# Patient Record
Sex: Female | Born: 1955 | Race: White | Hispanic: No | Marital: Married | Smoking: Never smoker
Health system: Southern US, Community
[De-identification: ages and names within clinical notes are randomized; demographics above are authoritative.]

## PROBLEM LIST (undated history)

## (undated) DIAGNOSIS — E785 Hyperlipidemia, unspecified: Secondary | ICD-10-CM

## (undated) DIAGNOSIS — K5790 Diverticulosis of intestine, part unspecified, without perforation or abscess without bleeding: Secondary | ICD-10-CM

## (undated) DIAGNOSIS — K589 Irritable bowel syndrome without diarrhea: Secondary | ICD-10-CM

## (undated) DIAGNOSIS — K219 Gastro-esophageal reflux disease without esophagitis: Secondary | ICD-10-CM

## (undated) HISTORY — DX: Diverticulosis of intestine, part unspecified, without perforation or abscess without bleeding: K57.90

## (undated) HISTORY — DX: Gastro-esophageal reflux disease without esophagitis: K21.9

## (undated) HISTORY — DX: Hyperlipidemia, unspecified: E78.5

---

## 1982-10-22 HISTORY — PX: TONSILLECTOMY: SUR1361

## 1984-10-22 HISTORY — PX: LAPAROSCOPY: SHX197

## 1997-10-22 HISTORY — PX: APPENDECTOMY: SHX54

## 2003-10-23 HISTORY — PX: ABDOMINAL HYSTERECTOMY: SHX81

## 2004-04-03 HISTORY — PX: UPPER GASTROINTESTINAL ENDOSCOPY: SHX188

## 2005-03-01 ENCOUNTER — Ambulatory Visit: Payer: Self-pay | Admitting: Obstetrics and Gynecology

## 2005-10-22 DIAGNOSIS — K5790 Diverticulosis of intestine, part unspecified, without perforation or abscess without bleeding: Secondary | ICD-10-CM

## 2005-10-22 HISTORY — DX: Diverticulosis of intestine, part unspecified, without perforation or abscess without bleeding: K57.90

## 2005-10-22 HISTORY — PX: COLONOSCOPY: SHX174

## 2006-03-05 ENCOUNTER — Ambulatory Visit: Payer: Self-pay | Admitting: Obstetrics and Gynecology

## 2006-03-18 ENCOUNTER — Ambulatory Visit: Payer: Self-pay | Admitting: Obstetrics and Gynecology

## 2006-07-05 ENCOUNTER — Ambulatory Visit: Payer: Self-pay | Admitting: Gastroenterology

## 2006-07-05 LAB — HM COLONOSCOPY

## 2007-05-22 ENCOUNTER — Ambulatory Visit: Payer: Self-pay | Admitting: Obstetrics and Gynecology

## 2008-06-30 ENCOUNTER — Ambulatory Visit: Payer: Self-pay | Admitting: Obstetrics and Gynecology

## 2009-06-20 ENCOUNTER — Ambulatory Visit: Payer: Self-pay | Admitting: Urology

## 2009-07-05 ENCOUNTER — Ambulatory Visit: Payer: Self-pay | Admitting: Unknown Physician Specialty

## 2010-07-12 ENCOUNTER — Ambulatory Visit: Payer: Self-pay

## 2011-07-27 ENCOUNTER — Ambulatory Visit: Payer: Self-pay | Admitting: Unknown Physician Specialty

## 2012-09-16 ENCOUNTER — Ambulatory Visit: Payer: Self-pay | Admitting: Family Medicine

## 2013-08-03 ENCOUNTER — Encounter: Payer: Self-pay | Admitting: General Surgery

## 2013-08-03 ENCOUNTER — Ambulatory Visit (INDEPENDENT_AMBULATORY_CARE_PROVIDER_SITE_OTHER): Payer: Managed Care, Other (non HMO) | Admitting: General Surgery

## 2013-08-03 VITALS — BP 130/80 | HR 78 | Resp 16 | Ht 65.0 in | Wt 120.0 lb

## 2013-08-03 DIAGNOSIS — Z1211 Encounter for screening for malignant neoplasm of colon: Secondary | ICD-10-CM

## 2013-08-03 NOTE — Progress Notes (Signed)
Patient ID: Christina Williams, female   DOB: 08-25-1956, 57 y.o.   MRN: [id number]  Chief Complaint  Patient presents with  . Other    evaluation of colonoscopy last done 2007    HPI Christina Williams is a 57 y.o. female who presents for an evaluation of a colonoscopy. The last colonoscopy performed was in 2007. At her last colonoscopy diverticulosis was noted. No known family or personal history of colon cancer or polyps. She denies any problems with the bowels at this time.   HPI  Past Medical History  Diagnosis Date  . Hyperlipidemia   . Diverticulosis 2007    Past Surgical History  Procedure Laterality Date  . Appendectomy  1999  . Tonsillectomy  1984  . Laparoscopy  1986  . Abdominal hysterectomy  2005  . Colonoscopy  2007    Family History  Problem Relation Age of Onset  . Cancer Mother     brain tumor    Social History History  Substance Use Topics  . Smoking status: Never Smoker   . Smokeless tobacco: Not on file  . Alcohol Use: Yes    No Known Allergies  Current Outpatient Prescriptions  Medication Sig Dispense Refill  . Calcium Carbonate-Vitamin D (CALCIUM 600 + D PO) Take 2 tablets by mouth daily.      . Lactobacillus (ACIDOPHILUS PO) Take 1 tablet by mouth daily.      . Omega-3 Fatty Acids (FISH OIL) 1200 MG CAPS Take 1 capsule by mouth daily.      . polycarbophil (FIBERCON) 625 MG tablet Take 3 tablets by mouth daily.      . simvastatin (ZOCOR) 20 MG tablet Take 1 tablet by mouth daily.       No current facility-administered medications for this visit.    Review of Systems Review of Systems  Constitutional: Negative.   Respiratory: Negative.   Cardiovascular: Negative.   Gastrointestinal: Negative.     Blood pressure 130/80, pulse 78, resp. rate 16, height 5\' 5"  (1.651 m), weight 120 lb (54.432 kg).  Physical Exam Physical Exam Not performed.  Data Reviewed  Previous colonoscopy report.   Assessment    Patient is not in need of colonoscopy  until 2017. No personal or family history of colon polyps or cancer. Last colonoscopy  in 2007 showed only diverticulosis.     Plan    Schedule follow up in 2017 for screening colonoscopy.       SANKAR,SEEPLAPUTHUR G 08/03/2013, 8:41 PM

## 2013-09-22 ENCOUNTER — Ambulatory Visit: Payer: Self-pay | Admitting: Family Medicine

## 2014-07-29 LAB — HEPATIC FUNCTION PANEL
ALT: 18 U/L (ref 7–35)
AST: 19 U/L (ref 13–35)

## 2014-07-29 LAB — BASIC METABOLIC PANEL
BUN: 11 mg/dL (ref 4–21)
CREATININE: 0.9 mg/dL (ref 0.5–1.1)
GLUCOSE: 99 mg/dL
Potassium: 4.5 mmol/L (ref 3.4–5.3)
Sodium: 142 mmol/L (ref 137–147)

## 2014-07-29 LAB — CBC AND DIFFERENTIAL
HCT: 43 % (ref 36–46)
Hemoglobin: 14.4 g/dL (ref 12.0–16.0)
Platelets: 230 10*3/uL (ref 150–399)
WBC: 4.9 10*3/mL

## 2014-07-29 LAB — TSH: TSH: 3.23 u[IU]/mL (ref 0.41–5.90)

## 2014-07-29 LAB — LIPID PANEL
CHOLESTEROL: 214 mg/dL — AB (ref 0–200)
HDL: 73 mg/dL — AB (ref 35–70)
LDL CALC: 124 mg/dL
TRIGLYCERIDES: 87 mg/dL (ref 40–160)

## 2014-08-23 ENCOUNTER — Encounter: Payer: Self-pay | Admitting: General Surgery

## 2014-10-01 ENCOUNTER — Ambulatory Visit: Payer: Self-pay | Admitting: Family Medicine

## 2014-10-01 LAB — HM MAMMOGRAPHY

## 2014-10-05 ENCOUNTER — Ambulatory Visit: Payer: Self-pay | Admitting: Family Medicine

## 2015-07-29 DIAGNOSIS — J309 Allergic rhinitis, unspecified: Secondary | ICD-10-CM | POA: Insufficient documentation

## 2015-07-29 DIAGNOSIS — M8580 Other specified disorders of bone density and structure, unspecified site: Secondary | ICD-10-CM | POA: Insufficient documentation

## 2015-07-29 DIAGNOSIS — Z87898 Personal history of other specified conditions: Secondary | ICD-10-CM | POA: Insufficient documentation

## 2015-07-29 DIAGNOSIS — E7800 Pure hypercholesterolemia, unspecified: Secondary | ICD-10-CM | POA: Insufficient documentation

## 2015-07-29 DIAGNOSIS — D229 Melanocytic nevi, unspecified: Secondary | ICD-10-CM | POA: Insufficient documentation

## 2015-07-29 DIAGNOSIS — R109 Unspecified abdominal pain: Secondary | ICD-10-CM | POA: Insufficient documentation

## 2015-07-29 DIAGNOSIS — N809 Endometriosis, unspecified: Secondary | ICD-10-CM | POA: Insufficient documentation

## 2015-07-29 DIAGNOSIS — K219 Gastro-esophageal reflux disease without esophagitis: Secondary | ICD-10-CM | POA: Insufficient documentation

## 2015-07-29 DIAGNOSIS — Z8619 Personal history of other infectious and parasitic diseases: Secondary | ICD-10-CM | POA: Insufficient documentation

## 2015-07-29 DIAGNOSIS — Z780 Asymptomatic menopausal state: Secondary | ICD-10-CM | POA: Insufficient documentation

## 2015-07-29 DIAGNOSIS — K589 Irritable bowel syndrome without diarrhea: Secondary | ICD-10-CM | POA: Insufficient documentation

## 2015-07-29 DIAGNOSIS — J302 Other seasonal allergic rhinitis: Secondary | ICD-10-CM | POA: Insufficient documentation

## 2015-08-03 ENCOUNTER — Encounter: Payer: Self-pay | Admitting: Physician Assistant

## 2015-08-03 ENCOUNTER — Ambulatory Visit: Payer: Commercial Managed Care - HMO | Admitting: Physician Assistant

## 2015-08-03 VITALS — BP 130/80 | HR 76 | Temp 98.2°F | Resp 16 | Ht 65.0 in | Wt 116.8 lb

## 2015-08-03 DIAGNOSIS — Z1239 Encounter for other screening for malignant neoplasm of breast: Secondary | ICD-10-CM

## 2015-08-03 DIAGNOSIS — Z124 Encounter for screening for malignant neoplasm of cervix: Secondary | ICD-10-CM

## 2015-08-03 DIAGNOSIS — Z1211 Encounter for screening for malignant neoplasm of colon: Secondary | ICD-10-CM

## 2015-08-03 DIAGNOSIS — Z0000 Encounter for general adult medical examination without abnormal findings: Secondary | ICD-10-CM

## 2015-08-03 DIAGNOSIS — E7800 Pure hypercholesterolemia, unspecified: Secondary | ICD-10-CM

## 2015-08-03 DIAGNOSIS — Z23 Encounter for immunization: Secondary | ICD-10-CM

## 2015-08-03 LAB — POCT URINALYSIS DIPSTICK
Bilirubin, UA: NEGATIVE
GLUCOSE UA: NEGATIVE
Ketones, UA: NEGATIVE
Leukocytes, UA: NEGATIVE
NITRITE UA: NEGATIVE
PH UA: 5
Protein, UA: NEGATIVE
RBC UA: NEGATIVE
SPEC GRAV UA: 1.015
UROBILINOGEN UA: 0.2

## 2015-08-03 LAB — IFOBT (OCCULT BLOOD): IFOBT: NEGATIVE

## 2015-08-03 NOTE — Progress Notes (Signed)
Patient: Christina Williams, Female    DOB: Apr 25, 1956, 59 y.o.   MRN: [id number] Visit Date: 08/03/2015  Today's Provider: Mar Daring, PA-C   Chief Complaint  Patient presents with  . Annual Exam   Subjective:    Annual physical exam Christina Williams is a 59 y.o. female who presents today for health maintenance and complete physical. She feels well. She reports exercising, rides bike, walks everyday and exercise videos. She reports she is sleeping fairly well.    Last PCP:07/28/2014 Mammogram: 10/01/2014 Further evaluation is suggested for possible mass in the left breast. Per patient went to get the other mammogram right after and it was normal. There is no family history of breast cancer. Colonoscopy: 07/05/2006 Diverticulosis. There is no family history of colon cancer. Pap: 2012 Normal. [facility] at [facility]. She is status post hysterectomy with cervix still intact and ovaries remaining. There is no family history of cervical or ovarian cancer. She has never tested HPV positive.   Review of Systems  Constitutional: Negative.   HENT: Negative.   Eyes: Negative.        Watery eyes  Respiratory: Negative.   Cardiovascular: Negative.   Gastrointestinal: Negative.   Endocrine: Negative.   Genitourinary: Negative.   Musculoskeletal: Negative.   Skin: Negative.   Allergic/Immunologic: Negative.   Neurological: Negative.   Hematological: Negative.   Psychiatric/Behavioral: Negative.     Social History She  reports that she has never smoked. She does not have any smokeless tobacco history on file. She reports that she drinks alcohol. She reports that she does not use illicit drugs. Social History   Social History  . Marital Status: Married    Spouse Name: N/A  . Number of Children: 2  . Years of Education: N/A   Social History Main Topics  . Smoking status: Never Smoker   . Smokeless tobacco: None  . Alcohol Use: Yes     Comment: Ocassional  . Drug  Use: No  . Sexual Activity: Not Asked   Other Topics Concern  . None   Social History Narrative    Patient Active Problem List   Diagnosis Date Noted  . Abdominal pain 07/29/2015  . Allergic rhinitis 07/29/2015  . Melanocytic nevus of skin 07/29/2015  . History of measles, mumps, or rubella 07/29/2015  . Endometriosis 07/29/2015  . GERD (gastroesophageal reflux disease) 07/29/2015  . Hypercholesteremia 07/29/2015  . Irritable bowel syndrome 07/29/2015  . Osteopenia 07/29/2015  . Postmenopausal 07/29/2015  . Seasonal allergies 07/29/2015    Past Surgical History  Procedure Laterality Date  . Appendectomy  1999  . Tonsillectomy  1984  . Laparoscopy  1986  . Abdominal hysterectomy  2005  . Colonoscopy  2007  . Upper gastrointestinal endoscopy  04/03/2004    Normal    Family History  Family Status  Relation Status Death Age  . Mother Deceased 75    Diverticulosis of colon  . Father Deceased 25    died fromheart infection; CABG  . Sister Alive   . Brother Alive     stent age 55  . Sister Alive    Her family history includes CAD in her father and mother; Cancer in her maternal aunt and mother; Diabetes in her mother and sister; GER disease in her father; Hyperlipidemia in her sister; Hypertension in her mother and sister.    No Known Allergies  Previous Medications   CETIRIZINE (ZYRTEC) 10 MG TABLET  Take 10 mg by mouth daily.   ESTRADIOL (ESTRACE VAGINAL) 0.1 MG/GM VAGINAL CREAM    Place vaginally.   HYOSCYAMINE (LEVBID) 0.375 MG 12 HR TABLET    Take by mouth.   LACTOBACILLUS (ACIDOPHILUS PO)    Take 1 tablet by mouth daily.   POLYCARBOPHIL (FIBERCON) 625 MG TABLET    Take 3 tablets by mouth daily.   SIMVASTATIN (ZOCOR) 20 MG TABLET    Take 1 tablet by mouth daily.    Patient Care Team: Margarita Rana, MD as PCP - General (Family Medicine) Seeplaputhur Robinette Haines, MD (General Surgery) Margarita Rana, MD as Referring Physician (Family Medicine)     Objective:    Vitals: BP 130/80 mmHg  Pulse 76  Temp(Src) 98.2 F (36.8 C) (Oral)  Resp 16  Ht 5\' 5"  (1.651 m)  Wt 116 lb 12.8 oz (52.98 kg)  BMI 19.44 kg/m2   Physical Exam  Constitutional: She is oriented to person, place, and time. She appears well-developed and well-nourished. No distress.  HENT:  Head: Normocephalic and atraumatic.  Right Ear: Hearing, tympanic membrane, external ear and ear canal normal.  Left Ear: Hearing, tympanic membrane, external ear and ear canal normal.  Nose: Nose normal.  Mouth/Throat: Uvula is midline, oropharynx is clear and moist and mucous membranes are normal. No oropharyngeal exudate.  Eyes: Conjunctivae and EOM are normal. Pupils are equal, round, and reactive to light. Right eye exhibits no discharge. Left eye exhibits no discharge. No scleral icterus.  Neck: Normal range of motion. Neck supple. No JVD present. Carotid bruit is not present. No tracheal deviation present. No thyromegaly present.  Cardiovascular: Normal rate, regular rhythm, normal heart sounds and intact distal pulses.  Exam reveals no gallop and no friction rub.   No murmur heard. Pulmonary/Chest: Effort normal and breath sounds normal. No respiratory distress. She has no wheezes. She has no rales. She exhibits no tenderness. Right breast exhibits no inverted nipple, no mass, no nipple discharge, no skin change and no tenderness. Left breast exhibits no inverted nipple, no mass, no nipple discharge, no skin change and no tenderness. Breasts are symmetrical.  Abdominal: Soft. Bowel sounds are normal. She exhibits no distension and no mass. There is no tenderness. There is no rebound and no guarding. Hernia confirmed negative in the right inguinal area and confirmed negative in the left inguinal area.  Genitourinary: Rectum normal and vagina normal. Rectal exam shows no external hemorrhoid, no internal hemorrhoid, no fissure, no mass, no tenderness and anal tone normal. Guaiac negative stool. No  breast swelling, tenderness, discharge or bleeding. Pelvic exam was performed with patient supine. There is no rash, tenderness, lesion or injury on the right labia. There is no rash, tenderness, lesion or injury on the left labia. Cervix exhibits no motion tenderness, no discharge and no friability. Right adnexum displays no mass, no tenderness and no fullness. Left adnexum displays no mass, no tenderness and no fullness. No erythema, tenderness or bleeding in the vagina. No signs of injury around the vagina. No vaginal discharge found.  Uterus is surgically absent  Musculoskeletal: Normal range of motion. She exhibits no edema or tenderness.  Lymphadenopathy:    She has no cervical adenopathy.       Right: No inguinal adenopathy present.       Left: No inguinal adenopathy present.  Neurological: She is alert and oriented to person, place, and time. She has normal reflexes. No cranial nerve deficit. Coordination normal.  Skin: Skin is warm and dry.  No rash noted. She is not diaphoretic.  Psychiatric: She has a normal mood and affect. Her behavior is normal. Judgment and thought content normal.  Vitals reviewed.    Depression Screen No flowsheet data found.    Assessment & Plan:     Routine Health Maintenance and Physical Exam  1. Annual physical exam UA was negative today. I will check labs as below and follow-up pending lab results or in one year for repeat annual physical exam if labs are normal. - POCT urinalysis dipstick - CBC with Differential - Comprehensive metabolic panel - TSH  2. Need for influenza vaccination Flu vaccine was given today without complications. - Flu Vaccine QUAD 36+ mos IM  3. Breast cancer screening Normal breast exam today. Order for mammogram has been placed. Mammogram will be due in December 2016. - Mammogram Digital Screening; Future  4. Cervical cancer screening Pelvic exam was normal today. Pap was done today. Will await results and follow-up  pending results. - Pap IG w/ reflex to HPV when [facility] ([facility])  5. Colon cancer screening OC-lite was negative today. She will be due for colonoscopy next year. - IFOBT POC (occult bld, rslt in office)  6. Hypercholesterolemia History of high cholesterol. She has been on simvastatin 20 mg. She tries to adhere to a low-cholesterol, low-fat diet and exercises regularly. I will check a lipid panel as below and follow-up pending these results. - Lipid panel   Exercise Activities and Dietary recommendations Goals    None      Immunization History  Administered Date(s) Administered  . Tdap 02/01/2010    Health Maintenance  Topic Date Due  . Hepatitis C Screening  1955-12-20  . HIV Screening  12/27/1970  . PAP SMEAR  12/26/1976  . INFLUENZA VACCINE  05/23/2015  . COLONOSCOPY  07/05/2016  . MAMMOGRAM  10/01/2016  . TETANUS/TDAP  02/02/2020      Discussed health benefits of physical activity, and encouraged her to engage in regular exercise appropriate for her age and condition.

## 2015-08-03 NOTE — Patient Instructions (Signed)
Health Maintenance, Female Adopting a healthy lifestyle and getting preventive care can go a long way to promote health and wellness. Talk with your health care provider about what schedule of regular examinations is right for you. This is a good chance for you to check in with your provider about disease prevention and staying healthy. In between checkups, there are plenty of things you can do on your own. Experts have done a lot of research about which lifestyle changes and preventive measures are most likely to keep you healthy. Ask your health care provider for more information. WEIGHT AND DIET  Eat a healthy diet 1. Be sure to include plenty of vegetables, fruits, low-fat dairy products, and lean protein. 2. Do not eat a lot of foods high in solid fats, added sugars, or salt. 3. Get regular exercise. This is one of the most important things you can do for your health. 1. Most adults should exercise for at least 150 minutes each week. The exercise should increase your heart rate and make you sweat (moderate-intensity exercise). 2. Most adults should also do strengthening exercises at least twice a week. This is in addition to the moderate-intensity exercise.  Maintain a healthy weight 1. Body mass index (BMI) is a measurement that can be used to identify possible weight problems. It estimates body fat based on height and weight. Your health care provider can help determine your BMI and help you achieve or maintain a healthy weight. 2. For females 56 years of age and older:  1. A BMI below 18.5 is considered underweight. 2. A BMI of 18.5 to 24.9 is normal. 3. A BMI of 25 to 29.9 is considered overweight. 4. A BMI of 30 and above is considered obese.  Watch levels of cholesterol and blood lipids 1. You should start having your blood tested for lipids and cholesterol at 59 years of age, then have this test every 5 years. 2. You may need to have your cholesterol levels checked more often  if: 1. Your lipid or cholesterol levels are high. 2. You are older than 59 years of age. 3. You are at high risk for heart disease.  CANCER SCREENING   Lung Cancer 1. Lung cancer screening is recommended for adults 53-40 years old who are at high risk for lung cancer because of a history of smoking. 2. A yearly low-dose CT scan of the lungs is recommended for people who: 1. Currently smoke. 2. Have quit within the past 15 years. 3. Have at least a 30-pack-year history of smoking. A pack year is smoking an average of one pack of cigarettes a day for 1 year. 3. Yearly screening should continue until it has been 15 years since you quit. 4. Yearly screening should stop if you develop a health problem that would prevent you from having lung cancer treatment.  Breast Cancer  Practice breast self-awareness. This means understanding how your breasts normally appear and feel.  It also means doing regular breast self-exams. Let your health care provider know about any changes, no matter how small.  If you are in your 20s or 30s, you should have a clinical breast exam (CBE) by a health care provider every 1-3 years as part of a regular health exam.  If you are 60 or older, have a CBE every year. Also consider having a breast X-ray (mammogram) every year.  If you have a family history of breast cancer, talk to your health care provider about genetic screening.  If you  are at high risk for breast cancer, talk to your health care provider about having an MRI and a mammogram every year.  Breast cancer gene (BRCA) assessment is recommended for women who have family members with BRCA-related cancers. BRCA-related cancers include:  Breast.  Ovarian.  Tubal.  Peritoneal cancers.  Results of the assessment will determine the need for genetic counseling and BRCA1 and BRCA2 testing. Cervical Cancer Your health care provider may recommend that you be screened regularly for cancer of the pelvic  organs (ovaries, uterus, and vagina). This screening involves a pelvic examination, including checking for microscopic changes to the surface of your cervix (Pap test). You may be encouraged to have this screening done every 3 years, beginning at age 23.  For women ages 38-65, health care providers may recommend pelvic exams and Pap testing every 3 years, or they may recommend the Pap and pelvic exam, combined with testing for human papilloma virus (HPV), every 5 years. Some types of HPV increase your risk of cervical cancer. Testing for HPV may also be done on women of any age with unclear Pap test results.  Other health care providers may not recommend any screening for nonpregnant women who are considered low risk for pelvic cancer and who do not have symptoms. Ask your health care provider if a screening pelvic exam is right for you.  If you have had past treatment for cervical cancer or a condition that could lead to cancer, you need Pap tests and screening for cancer for at least 20 years after your treatment. If Pap tests have been discontinued, your risk factors (such as having a new sexual partner) need to be reassessed to determine if screening should resume. Some women have medical problems that increase the chance of getting cervical cancer. In these cases, your health care provider may recommend more frequent screening and Pap tests. Colorectal Cancer  This type of cancer can be detected and often prevented.  Routine colorectal cancer screening usually begins at 59 years of age and continues through 59 years of age.  Your health care provider may recommend screening at an earlier age if you have risk factors for colon cancer.  Your health care provider may also recommend using home test kits to check for hidden blood in the stool.  A small camera at the end of a tube can be used to examine your colon directly (sigmoidoscopy or colonoscopy). This is done to check for the earliest forms  of colorectal cancer.  Routine screening usually begins at age 17.  Direct examination of the colon should be repeated every 5-10 years through 59 years of age. However, you may need to be screened more often if early forms of precancerous polyps or small growths are found. Skin Cancer  Check your skin from head to toe regularly.  Tell your health care provider about any new moles or changes in moles, especially if there is a change in a mole's shape or color.  Also tell your health care provider if you have a mole that is larger than the size of a pencil eraser.  Always use sunscreen. Apply sunscreen liberally and repeatedly throughout the day.  Protect yourself by wearing long sleeves, pants, a wide-brimmed hat, and sunglasses whenever you are outside. HEART DISEASE, DIABETES, AND HIGH BLOOD PRESSURE   High blood pressure causes heart disease and increases the risk of stroke. High blood pressure is more likely to develop in:  People who have blood pressure in the high end  of the normal range (130-139/85-89 mm Hg).  People who are overweight or obese.  People who are African American.  If you are 85-42 years of age, have your blood pressure checked every 3-5 years. If you are 20 years of age or older, have your blood pressure checked every year. You should have your blood pressure measured twice--once when you are at a hospital or clinic, and once when you are not at a hospital or clinic. Record the average of the two measurements. To check your blood pressure when you are not at a hospital or clinic, you can use:  An automated blood pressure machine at a pharmacy.  A home blood pressure monitor.  If you are between 19 years and 53 years old, ask your health care provider if you should take aspirin to prevent strokes.  Have regular diabetes screenings. This involves taking a blood sample to check your fasting blood sugar level.  If you are at a normal weight and have a low risk  for diabetes, have this test once every three years after 59 years of age.  If you are overweight and have a high risk for diabetes, consider being tested at a younger age or more often. PREVENTING INFECTION  Hepatitis B  If you have a higher risk for hepatitis B, you should be screened for this virus. You are considered at high risk for hepatitis B if:  You were born in a country where hepatitis B is common. Ask your health care provider which countries are considered high risk.  Your parents were born in a high-risk country, and you have not been immunized against hepatitis B (hepatitis B vaccine).  You have HIV or AIDS.  You use needles to inject street drugs.  You live with someone who has hepatitis B.  You have had sex with someone who has hepatitis B.  You get hemodialysis treatment.  You take certain medicines for conditions, including cancer, organ transplantation, and autoimmune conditions. Hepatitis C  Blood testing is recommended for:  Everyone born from [birth year] through 1965.  Anyone with known risk factors for hepatitis C. Sexually transmitted infections (STIs)  You should be screened for sexually transmitted infections (STIs) including gonorrhea and chlamydia if:  You are sexually active and are younger than 59 years of age.  You are older than 59 years of age and your health care provider tells you that you are at risk for this type of infection.  Your sexual activity has changed since you were last screened and you are at an increased risk for chlamydia or gonorrhea. Ask your health care provider if you are at risk.  If you do not have HIV, but are at risk, it may be recommended that you take a prescription medicine daily to prevent HIV infection. This is called pre-exposure prophylaxis (PrEP). You are considered at risk if:  You are sexually active and do not regularly use condoms or know the HIV status of your partner(s).  You take drugs by injection.  You  are sexually active with a partner who has HIV. Talk with your health care provider about whether you are at high risk of being infected with HIV. If you choose to begin PrEP, you should first be tested for HIV. You should then be tested every 3 months for as long as you are taking PrEP.  PREGNANCY   If you are premenopausal and you may become pregnant, ask your health care provider about preconception counseling.  If you may  become pregnant, take 400 to 800 micrograms (mcg) of folic acid every day.  If you want to prevent pregnancy, talk to your health care provider about birth control (contraception). OSTEOPOROSIS AND MENOPAUSE   Osteoporosis is a disease in which the bones lose minerals and strength with aging. This can result in serious bone fractures. Your risk for osteoporosis can be identified using a bone density scan.  If you are 65 years of age or older, or if you are at risk for osteoporosis and fractures, ask your health care provider if you should be screened.  Ask your health care provider whether you should take a calcium or vitamin D supplement to lower your risk for osteoporosis.  Menopause may have certain physical symptoms and risks.  Hormone replacement therapy may reduce some of these symptoms and risks. Talk to your health care provider about whether hormone replacement therapy is right for you.  HOME CARE INSTRUCTIONS   Schedule regular health, dental, and eye exams.  Stay current with your immunizations.   Do not use any tobacco products including cigarettes, chewing tobacco, or electronic cigarettes.  If you are pregnant, do not drink alcohol.  If you are breastfeeding, limit how much and how often you drink alcohol.  Limit alcohol intake to no more than 1 drink per day for nonpregnant women. One drink equals 12 ounces of beer, 5 ounces of wine, or 1 ounces of hard liquor.  Do not use street drugs.  Do not share needles.  Ask your health care provider  for help if you need support or information about quitting drugs.  Tell your health care provider if you often feel depressed.  Tell your health care provider if you have ever been abused or do not feel safe at home.   This information is not intended to replace advice given to you by your health care provider. Make sure you discuss any questions you have with your health care provider.   Document Released: 04/23/2011 Document Revised: 10/29/2014 Document Reviewed: 09/09/2013 Elsevier Interactive Patient Education [employer].     Why follow it? Research shows. . Those who follow the Mediterranean diet have a reduced risk of heart disease  . The diet is associated with a reduced incidence of Parkinson's and Alzheimer's diseases . People following the diet may have longer life expectancies and lower rates of chronic diseases  . The Dietary Guidelines for Americans recommends the Mediterranean diet as an eating plan to promote health and prevent disease  What Is the Mediterranean Diet?  . Healthy eating plan based on typical foods and recipes of Mediterranean-style cooking . The diet is primarily a plant based diet; these foods should make up a majority of meals   Starches - Plant based foods should make up a majority of meals - They are an important sources of vitamins, minerals, energy, antioxidants, and fiber - Choose whole grains, foods high in fiber and minimally processed items  - Typical grain sources include wheat, oats, barley, corn, brown rice, bulgar, farro, millet, polenta, couscous  - Various types of beans include chickpeas, lentils, fava beans, black beans, white beans   Fruits  Veggies - Large quantities of antioxidant rich fruits & veggies; 6 or more servings  - Vegetables can be eaten raw or lightly drizzled with oil and cooked  - Vegetables common to the traditional Mediterranean Diet include: artichokes, arugula, beets, broccoli, brussel sprouts, cabbage, carrots,  celery, collard greens, cucumbers, eggplant, kale, leeks, lemons, lettuce, mushrooms, okra, onions,  peas, peppers, potatoes, pumpkin, radishes, rutabaga, shallots, spinach, sweet potatoes, turnips, zucchini - Fruits common to the Mediterranean Diet include: apples, apricots, avocados, cherries, clementines, dates, figs, grapefruits, grapes, melons, nectarines, oranges, peaches, pears, pomegranates, strawberries, tangerines  Fats - Replace butter and margarine with healthy oils, such as olive oil, canola oil, and tahini  - Limit nuts to no more than a handful a day  - Nuts include walnuts, almonds, pecans, pistachios, pine nuts  - Limit or avoid candied, honey roasted or heavily salted nuts - Olives are central to the Mediterranean diet - can be eaten whole or used in a variety of dishes   Meats Protein - Limiting red meat: no more than a few times a month - When eating red meat: choose lean cuts and keep the portion to the size of deck of cards - Eggs: approx. 0 to 4 times a week  - Fish and lean poultry: at least 2 a week  - Healthy protein sources include, chicken, turkey, lean beef, lamb - Increase intake of seafood such as tuna, salmon, trout, mackerel, shrimp, scallops - Avoid or limit high fat processed meats such as sausage and bacon  Dairy - Include moderate amounts of low fat dairy products  - Focus on healthy dairy such as fat free yogurt, skim milk, low or reduced fat cheese - Limit dairy products higher in fat such as whole or 2% milk, cheese, ice cream  Alcohol - Moderate amounts of red wine is ok  - No more than 5 oz daily for women (all ages) and men older than age 65  - No more than 10 oz of wine daily for men younger than 65  Other - Limit sweets and other desserts  - Use herbs and spices instead of salt to flavor foods  - Herbs and spices common to the traditional Mediterranean Diet include: basil, bay leaves, chives, cloves, cumin, fennel, garlic, lavender, marjoram, mint,  oregano, parsley, pepper, rosemary, sage, savory, sumac, tarragon, thyme   It's not just a diet, it's a lifestyle:  . The Mediterranean diet includes lifestyle factors typical of those in the region  . Foods, drinks and meals are best eaten with others and savored . Daily physical activity is important for overall good health . This could be strenuous exercise like running and aerobics . This could also be more leisurely activities such as walking, housework, yard-work, or taking the stairs . Moderation is the key; a balanced and healthy diet accommodates most foods and drinks . Consider portion sizes and frequency of consumption of certain foods   Meal Ideas & Options:  . Breakfast:  o Whole wheat toast or whole wheat English muffins with peanut butter & hard boiled egg o Steel cut oats topped with apples & cinnamon and skim milk  o Fresh fruit: banana, strawberries, melon, berries, peaches  o Smoothies: strawberries, bananas, greek yogurt, peanut butter o Low fat greek yogurt with blueberries and granola  o Egg white omelet with spinach and mushrooms o Breakfast couscous: whole wheat couscous, apricots, skim milk, cranberries  . Sandwiches:  o Hummus and grilled vegetables (peppers, zucchini, squash) on whole wheat bread   o Grilled chicken on whole wheat pita with lettuce, tomatoes, cucumbers or tzatziki  o Tuna salad on whole wheat bread: tuna salad made with greek yogurt, olives, red peppers, capers, green onions o Garlic rosemary lamb pita: lamb sauted with garlic, rosemary, salt & pepper; add lettuce, cucumber, greek yogurt to pita - flavor with   lemon juice and black pepper  . Seafood:  o Mediterranean grilled salmon, seasoned with garlic, basil, parsley, lemon juice and black pepper o Shrimp, lemon, and spinach whole-grain pasta salad made with low fat greek yogurt  o Seared scallops with lemon orzo  o Seared tuna steaks seasoned salt, pepper, coriander topped with tomato  mixture of olives, tomatoes, olive oil, minced garlic, parsley, green onions and cappers  . Meats:  o Herbed greek chicken salad with kalamata olives, cucumber, feta  o Red bell peppers stuffed with spinach, bulgur, lean ground beef (or lentils) & topped with feta   o Kebabs: skewers of chicken, tomatoes, onions, zucchini, squash  o [place] burgers: made with red onions, mint, dill, lemon juice, feta cheese topped with roasted red peppers . Vegetarian o Cucumber salad: cucumbers, artichoke hearts, celery, red onion, feta cheese, tossed in olive oil & lemon juice  o Hummus and whole grain pita points with a greek salad (lettuce, tomato, feta, olives, cucumbers, red onion) o Lentil soup with celery, carrots made with vegetable broth, garlic, salt and pepper  o Tabouli salad: parsley, bulgur, mint, scallions, cucumbers, tomato, radishes, lemon juice, olive oil, salt and pepper.      American Heart Association (AHA) Exercise Recommendation  Being physically active is important to prevent heart disease and stroke, the nation's No. 1and No. 5killers. To improve overall cardiovascular health, we suggest at least 150 minutes per week of moderate exercise or 75 minutes per week of vigorous exercise (or a combination of moderate and vigorous activity). Thirty minutes a day, five times a week is an easy goal to remember. You will also experience benefits even if you divide your time into two or three segments of 10 to 15 minutes per day.  For people who would benefit from lowering their blood pressure or cholesterol, we recommend 40 minutes of aerobic exercise of moderate to vigorous intensity three to four times a week to lower the risk for heart attack and stroke.  Physical activity is anything that makes you move your body and burn calories.  This includes things like climbing stairs or playing sports. Aerobic exercises benefit your heart, and include walking, jogging, swimming or biking. Strength and  stretching exercises are best for overall stamina and flexibility.  The simplest, positive change you can make to effectively improve your heart health is to start walking. It's enjoyable, free, easy, social and great exercise. A walking program is flexible and boasts high success rates because people can stick with it. It's easy for walking to become a regular and satisfying part of life.   For Overall Cardiovascular Health:  At least 30 minutes of moderate-intensity aerobic activity at least 5 days per week for a total of 150  OR   At least 25 minutes of vigorous aerobic activity at least 3 days per week for a total of 75 minutes; or a combination of moderate- and vigorous-intensity aerobic activity  AND   Moderate- to high-intensity muscle-strengthening activity at least 2 days per week for additional health benefits.  For Lowering Blood Pressure and Cholesterol  An average 40 minutes of moderate- to vigorous-intensity aerobic activity 3 or 4 times per week  What if I can't make it to the time goal? Something is always better than nothing! And everyone has to start somewhere. Even if you've been sedentary for years, today is the day you can begin to make healthy changes in your life. If you don't think you'll make it for 30 or  40 minutes, set a reachable goal for today. You can work up toward your overall goal by increasing your time as you get stronger. Don't let all-or-nothing thinking rob you of doing what you can every day.  Source:http://www.heart.org

## 2015-08-07 LAB — PAP IG W/ RFLX HPV ASCU: PAP Smear Comment: 0

## 2015-08-08 ENCOUNTER — Telehealth: Payer: Self-pay

## 2015-08-08 NOTE — Telephone Encounter (Signed)
-----   Message from Mar Daring, PA-C sent at 08/08/2015  8:48 AM EDT ----- Normal pap

## 2015-08-08 NOTE — Telephone Encounter (Signed)
Patient advised as directed below.  Thanks,  -Seferino Oscar 

## 2015-08-13 LAB — CBC WITH DIFFERENTIAL/PLATELET
BASOS: 1 %
Basophils Absolute: 0 10*3/uL (ref 0.0–0.2)
EOS (ABSOLUTE): 0.2 10*3/uL (ref 0.0–0.4)
EOS: 3 %
HEMATOCRIT: 42.2 % (ref 34.0–46.6)
Hemoglobin: 14.1 g/dL (ref 11.1–15.9)
IMMATURE GRANS (ABS): 0 10*3/uL (ref 0.0–0.1)
IMMATURE GRANULOCYTES: 0 %
LYMPHS: 42 %
Lymphocytes Absolute: 2.4 10*3/uL (ref 0.7–3.1)
MCH: 30.9 pg (ref 26.6–33.0)
MCHC: 33.4 g/dL (ref 31.5–35.7)
MCV: 93 fL (ref 79–97)
Monocytes Absolute: 0.7 10*3/uL (ref 0.1–0.9)
Monocytes: 13 %
NEUTROS PCT: 41 %
Neutrophils Absolute: 2.3 10*3/uL (ref 1.4–7.0)
PLATELETS: 196 10*3/uL (ref 150–379)
RBC: 4.56 x10E6/uL (ref 3.77–5.28)
RDW: 13 % (ref 12.3–15.4)
WBC: 5.7 10*3/uL (ref 3.4–10.8)

## 2015-08-13 LAB — COMPREHENSIVE METABOLIC PANEL
A/G RATIO: 2 (ref 1.1–2.5)
ALT: 19 IU/L (ref 0–32)
AST: 21 IU/L (ref 0–40)
Albumin: 4.3 g/dL (ref 3.5–5.5)
Alkaline Phosphatase: 62 IU/L (ref 39–117)
BUN/Creatinine Ratio: 17 (ref 9–23)
BUN: 14 mg/dL (ref 6–24)
Bilirubin Total: 0.8 mg/dL (ref 0.0–1.2)
CALCIUM: 9.7 mg/dL (ref 8.7–10.2)
CO2: 26 mmol/L (ref 18–29)
CREATININE: 0.83 mg/dL (ref 0.57–1.00)
Chloride: 102 mmol/L (ref 97–106)
GFR, EST AFRICAN AMERICAN: 89 mL/min/{1.73_m2} (ref >59)
GFR, EST NON AFRICAN AMERICAN: 77 mL/min/{1.73_m2} (ref >59)
GLOBULIN, TOTAL: 2.2 g/dL (ref 1.5–4.5)
Glucose: 90 mg/dL (ref 65–99)
POTASSIUM: 4.8 mmol/L (ref 3.5–5.2)
Sodium: 142 mmol/L (ref 136–144)
TOTAL PROTEIN: 6.5 g/dL (ref 6.0–8.5)

## 2015-08-13 LAB — LIPID PANEL
CHOL/HDL RATIO: 3.2 ratio (ref 0.0–4.4)
Cholesterol, Total: 206 mg/dL — ABNORMAL HIGH (ref 100–199)
HDL: 65 mg/dL (ref >39)
LDL CALC: 119 mg/dL — AB (ref 0–99)
Triglycerides: 109 mg/dL (ref 0–149)
VLDL CHOLESTEROL CAL: 22 mg/dL (ref 5–40)

## 2015-08-13 LAB — TSH: TSH: 2.51 u[IU]/mL (ref 0.450–4.500)

## 2015-08-16 ENCOUNTER — Telehealth: Payer: Self-pay

## 2015-08-16 NOTE — Telephone Encounter (Signed)
LMTCB 08/16/15  Thanks,  -Colleen Donahoe

## 2015-08-16 NOTE — Telephone Encounter (Signed)
Patient advised as directed below.  Thanks,  -Maleigha Colvard 

## 2015-08-16 NOTE — Telephone Encounter (Signed)
-----   Message from Mar Daring, [place] sent at 08/13/2015  9:10 AM EDT ----- See result note already attached.

## 2015-12-04 ENCOUNTER — Other Ambulatory Visit: Payer: Self-pay | Admitting: Family Medicine

## 2015-12-04 DIAGNOSIS — E7800 Pure hypercholesterolemia, unspecified: Secondary | ICD-10-CM

## 2016-02-13 ENCOUNTER — Other Ambulatory Visit: Payer: Self-pay | Admitting: Family Medicine

## 2016-02-13 DIAGNOSIS — K588 Other irritable bowel syndrome: Secondary | ICD-10-CM

## 2016-02-13 MED ORDER — HYOSCYAMINE SULFATE ER 0.375 MG PO TB12: 0.3750 mg | ORAL_TABLET | Freq: Two times a day (BID) | ORAL | Status: DC

## 2016-02-13 NOTE — Telephone Encounter (Signed)
Pt needs new Rx for hyoscyamine (LEVBID) 0.375 MG 12 hr tablet Taking 09/21/14 -- Historical Provider, MD   She uses [facility]

## 2016-06-06 ENCOUNTER — Encounter: Payer: Self-pay | Admitting: *Deleted

## 2016-08-03 ENCOUNTER — Ambulatory Visit (INDEPENDENT_AMBULATORY_CARE_PROVIDER_SITE_OTHER): Payer: 59 | Admitting: Physician Assistant

## 2016-08-03 ENCOUNTER — Encounter: Payer: Self-pay | Admitting: Physician Assistant

## 2016-08-03 VITALS — BP 116/78 | HR 76 | Temp 98.1°F | Resp 16 | Ht 65.0 in | Wt 115.0 lb

## 2016-08-03 DIAGNOSIS — Z833 Family history of diabetes mellitus: Secondary | ICD-10-CM

## 2016-08-03 DIAGNOSIS — Z0000 Encounter for general adult medical examination without abnormal findings: Secondary | ICD-10-CM

## 2016-08-03 DIAGNOSIS — G4762 Sleep related leg cramps: Secondary | ICD-10-CM

## 2016-08-03 DIAGNOSIS — Z1239 Encounter for other screening for malignant neoplasm of breast: Secondary | ICD-10-CM

## 2016-08-03 DIAGNOSIS — Z1231 Encounter for screening mammogram for malignant neoplasm of breast: Secondary | ICD-10-CM

## 2016-08-03 DIAGNOSIS — Z1159 Encounter for screening for other viral diseases: Secondary | ICD-10-CM | POA: Diagnosis not present

## 2016-08-03 DIAGNOSIS — E7800 Pure hypercholesterolemia, unspecified: Secondary | ICD-10-CM | POA: Diagnosis not present

## 2016-08-03 LAB — POCT URINALYSIS DIPSTICK
Bilirubin, UA: NEGATIVE
Glucose, UA: NEGATIVE
Ketones, UA: NEGATIVE
Leukocytes, UA: NEGATIVE
Nitrite, UA: NEGATIVE
PH UA: 6
PROTEIN UA: NEGATIVE
RBC UA: NEGATIVE
SPEC GRAV UA: 1.015
UROBILINOGEN UA: 0.2

## 2016-08-03 NOTE — Progress Notes (Signed)
Patient: Christina Williams, Female    DOB: 1956-08-11, 60 y.o.   MRN: [id number] Visit Date: 08/03/2016  Today's Provider: Mar Daring, PA-C   Chief Complaint  Patient presents with  . Annual Exam   Subjective:    Annual physical exam Christina Williams is a 60 y.o. female who presents today for health maintenance and complete physical. She feels well. She reports exercising daily. She reports she is sleeping well. 08/03/15 CPE 08/03/15 Pap-normal 10/05/14 Mammogramo-BI-RADS 1 07/05/06 Colonoscopy-diverticulosis, recheck in 5 yrs. Only complaint is new onset nocturnal leg cramps. Does not occur nightly, only occasionally. -----------------------------------------------------------------   Review of Systems  Constitutional: Negative.   HENT: Negative.   Eyes: Negative.   Respiratory: Negative.   Cardiovascular: Negative.   Gastrointestinal: Negative.   Endocrine: Negative.   Genitourinary: Negative.   Musculoskeletal: Negative.   Skin: Negative.   Allergic/Immunologic: Negative.   Neurological: Negative.   Hematological: Negative.   Psychiatric/Behavioral: Negative.     Social History      She  reports that she has never smoked. She has never used smokeless tobacco. She reports that she drinks alcohol. She reports that she does not use drugs.       Social History   Social History  . Marital status: Married    Spouse name: N/A  . Number of children: 2  . Years of education: N/A   Social History Main Topics  . Smoking status: Never Smoker  . Smokeless tobacco: Never Used  . Alcohol use Yes     Comment: Ocassional  . Drug use: No  . Sexual activity: Not Asked   Other Topics Concern  . None   Social History Narrative  . None    Past Medical History:  Diagnosis Date  . Diverticulosis 2007  . GERD (gastroesophageal reflux disease)   . Hyperlipidemia      Patient Active Problem List   Diagnosis Date Noted  . Abdominal pain 07/29/2015  .  Allergic rhinitis 07/29/2015  . Melanocytic nevus of skin 07/29/2015  . History of measles, mumps, or rubella 07/29/2015  . Endometriosis 07/29/2015  . GERD (gastroesophageal reflux disease) 07/29/2015  . Hypercholesteremia 07/29/2015  . Irritable bowel syndrome 07/29/2015  . Osteopenia 07/29/2015  . Postmenopausal 07/29/2015  . Seasonal allergies 07/29/2015    Past Surgical History:  Procedure Laterality Date  . ABDOMINAL HYSTERECTOMY  2005  . APPENDECTOMY  1999  . COLONOSCOPY  2007  . LAPAROSCOPY  1986  . TONSILLECTOMY  1984  . UPPER GASTROINTESTINAL ENDOSCOPY  04/03/2004   Normal    Family History        Family Status  Relation Status  . Mother Deceased at age 58   Diverticulosis of colon  . Father Deceased at age 27   died fromheart infection; CABG  . Sister Alive  . Brother Alive   stent age 58  . Sister Alive  . Maternal Aunt         Her family history includes CAD in her father and mother; Cancer in her maternal aunt and mother; Diabetes in her mother and sister; GER disease in her father; Hyperlipidemia in her sister; Hypertension in her mother and sister.    No Known Allergies  Current Meds  Medication Sig  . FLUVIRIN SUSP ADM 0.5ML IM UTD  . hyoscyamine (LEVBID) 0.375 MG 12 hr tablet Take 1 tablet (0.375 mg total) by mouth 2 (two) times daily.  . Lactobacillus (ACIDOPHILUS PO) Take  1 tablet by mouth daily.  . polycarbophil (FIBERCON) 625 MG tablet Take 3 tablets by mouth daily.  . simvastatin (ZOCOR) 20 MG tablet Take 1 tablet by mouth at  bedtime    Patient Care Team: Mar Daring, PA-C as PCP - General (Family Medicine) Seeplaputhur Robinette Haines, MD (General Surgery) Margarita Rana, MD as Referring Physician (Family Medicine)     Objective:   Vitals: BP 116/78 (BP Location: Right Arm, Patient Position: Sitting, Cuff Size: Large)   Pulse 76   Temp 98.1 F (36.7 C) (Oral)   Resp 16   Ht 5\' 5"  (1.651 m)   Wt 115 lb (52.2 kg)   SpO2 99%    BMI 19.14 kg/m    Physical Exam  Constitutional: She is oriented to person, place, and time. She appears well-developed and well-nourished. No distress.  HENT:  Head: Normocephalic and atraumatic.  Right Ear: Hearing, tympanic membrane, external ear and ear canal normal.  Left Ear: Hearing, tympanic membrane, external ear and ear canal normal.  Nose: Nose normal.  Mouth/Throat: Uvula is midline, oropharynx is clear and moist and mucous membranes are normal. No oropharyngeal exudate.  Eyes: Conjunctivae and EOM are normal. Pupils are equal, round, and reactive to light. Right eye exhibits no discharge. Left eye exhibits no discharge. No scleral icterus.  Neck: Normal range of motion. Neck supple. No JVD present. Carotid bruit is not present. No tracheal deviation present. No thyromegaly present.  Cardiovascular: Normal rate, regular rhythm, normal heart sounds and intact distal pulses.  Exam reveals no gallop and no friction rub.   No murmur heard. Pulmonary/Chest: Effort normal and breath sounds normal. No respiratory distress. She has no wheezes. She has no rales. She exhibits no tenderness.  Abdominal: Soft. Bowel sounds are normal. She exhibits no distension and no mass. There is no tenderness. There is no rebound and no guarding.  Musculoskeletal: Normal range of motion. She exhibits no edema or tenderness.  Lymphadenopathy:    She has no cervical adenopathy.  Neurological: She is alert and oriented to person, place, and time.  Skin: Skin is warm and dry. No rash noted. She is not diaphoretic.  Psychiatric: She has a normal mood and affect. Her behavior is normal. Judgment and thought content normal.  Vitals reviewed.  Depression Screen PHQ 2/9 Scores 08/03/2016  PHQ - 2 Score 0    Assessment & Plan:     Routine Health Maintenance and Physical Exam  Exercise Activities and Dietary recommendations Goals    None      Immunization History  Administered Date(s) Administered    . Influenza,inj,Quad PF,36+ Mos 08/03/2015  . Tdap 02/01/2010    Health Maintenance  Topic Date Due  . Hepatitis C Screening  21-Sep-1956  . HIV Screening  12/27/1970  . ZOSTAVAX  12/27/2015  . INFLUENZA VACCINE  05/22/2016  . COLONOSCOPY  07/05/2016  . MAMMOGRAM  10/01/2016  . PAP SMEAR  08/03/2018  . TETANUS/TDAP  02/02/2020      Discussed health benefits of physical activity, and encouraged her to engage in regular exercise appropriate for her age and condition.   1. Annual physical exam Normal physical exam today. UA normal. She is following up with GI next week for IBS and possible colonoscopy. Will check labs as below and f/u pending lab results. If labs are stable and WNL she will not need to have these rechecked for one year at her next annual physical exam. She is to call the office in the  meantime if she has any acute issue, questions or concerns. - POCT urinalysis dipstick - CBC with Differential - Comprehensive metabolic panel - TSH  2. Breast cancer screening Breast exam today was normal. There is no family history of breast cancer. She does perform regular self breast exams. Mammogram was ordered as below. Information for [facility] was given to patient so she may schedule her mammogram at her convenience. - Mammogram Digital Screening; Future  3. Family history of diabetes mellitus Will check labs as below and f/u pending results. - Hemoglobin A1c  4. Nocturnal leg cramps Will check labs and f/u pending results. May be hypomagnesemia. Discussed magnesium OTC supplement 250mg . If labs stable may be from statin and may add CoQ10 instead. Will f/u pending results.  - CBC with Differential - Comprehensive metabolic panel - Magnesium  5. Hypercholesterolemia Known high cholesterol on max dose tolerated 20mg  simvastatin. She already eats very healthy and exercises daily. Will check labs as below and f/u pending results. - Lipid panel  6. Need for  hepatitis C screening test - Hepatitis C antibody screen   --------------------------------------------------------------------    Mar Daring, PA-C  [facility]

## 2016-08-03 NOTE — Patient Instructions (Signed)
Health Maintenance, Female Adopting a healthy lifestyle and getting preventive care can go a long way to promote health and wellness. Talk with your health care provider about what schedule of regular examinations is right for you. This is a good chance for you to check in with your provider about disease prevention and staying healthy. In between checkups, there are plenty of things you can do on your own. Experts have done a lot of research about which lifestyle changes and preventive measures are most likely to keep you healthy. Ask your health care provider for more information. WEIGHT AND DIET  Eat a healthy diet  Be sure to include plenty of vegetables, fruits, low-fat dairy products, and lean protein.  Do not eat a lot of foods high in solid fats, added sugars, or salt.  Get regular exercise. This is one of the most important things you can do for your health.  Most adults should exercise for at least 150 minutes each week. The exercise should increase your heart rate and make you sweat (moderate-intensity exercise).  Most adults should also do strengthening exercises at least twice a week. This is in addition to the moderate-intensity exercise.  Maintain a healthy weight  Body mass index (BMI) is a measurement that can be used to identify possible weight problems. It estimates body fat based on height and weight. Your health care provider can help determine your BMI and help you achieve or maintain a healthy weight.  For females 56 years of age and older:   A BMI below 18.5 is considered underweight.  A BMI of 18.5 to 24.9 is normal.  A BMI of 25 to 29.9 is considered overweight.  A BMI of 30 and above is considered obese.  Watch levels of cholesterol and blood lipids  You should start having your blood tested for lipids and cholesterol at 60 years of age, then have this test every 5 years.  You may need to have your cholesterol levels checked more often if:  Your lipid  or cholesterol levels are high.  You are older than 60 years of age.  You are at high risk for heart disease.  CANCER SCREENING   Lung Cancer  Lung cancer screening is recommended for adults 61-31 years old who are at high risk for lung cancer because of a history of smoking.  A yearly low-dose CT scan of the lungs is recommended for people who:  Currently smoke.  Have quit within the past 15 years.  Have at least a 30-pack-year history of smoking. A pack year is smoking an average of one pack of cigarettes a day for 1 year.  Yearly screening should continue until it has been 15 years since you quit.  Yearly screening should stop if you develop a health problem that would prevent you from having lung cancer treatment.  Breast Cancer  Practice breast self-awareness. This means understanding how your breasts normally appear and feel.  It also means doing regular breast self-exams. Let your health care provider know about any changes, no matter how small.  If you are in your 20s or 30s, you should have a clinical breast exam (CBE) by a health care provider every 1-3 years as part of a regular health exam.  If you are 30 or older, have a CBE every year. Also consider having a breast X-ray (mammogram) every year.  If you have a family history of breast cancer, talk to your health care provider about genetic screening.  If you  are at high risk for breast cancer, talk to your health care provider about having an MRI and a mammogram every year.  Breast cancer gene (BRCA) assessment is recommended for women who have family members with BRCA-related cancers. BRCA-related cancers include:  Breast.  Ovarian.  Tubal.  Peritoneal cancers.  Results of the assessment will determine the need for genetic counseling and BRCA1 and BRCA2 testing. Cervical Cancer Your health care provider may recommend that you be screened regularly for cancer of the pelvic organs (ovaries, uterus, and  vagina). This screening involves a pelvic examination, including checking for microscopic changes to the surface of your cervix (Pap test). You may be encouraged to have this screening done every 3 years, beginning at age 21.  For women ages 30-65, health care providers may recommend pelvic exams and Pap testing every 3 years, or they may recommend the Pap and pelvic exam, combined with testing for human papilloma virus (HPV), every 5 years. Some types of HPV increase your risk of cervical cancer. Testing for HPV may also be done on women of any age with unclear Pap test results.  Other health care providers may not recommend any screening for nonpregnant women who are considered low risk for pelvic cancer and who do not have symptoms. Ask your health care provider if a screening pelvic exam is right for you.  If you have had past treatment for cervical cancer or a condition that could lead to cancer, you need Pap tests and screening for cancer for at least 20 years after your treatment. If Pap tests have been discontinued, your risk factors (such as having a new sexual partner) need to be reassessed to determine if screening should resume. Some women have medical problems that increase the chance of getting cervical cancer. In these cases, your health care provider may recommend more frequent screening and Pap tests. Colorectal Cancer  This type of cancer can be detected and often prevented.  Routine colorectal cancer screening usually begins at 60 years of age and continues through 60 years of age.  Your health care provider may recommend screening at an earlier age if you have risk factors for colon cancer.  Your health care provider may also recommend using home test kits to check for hidden blood in the stool.  A small camera at the end of a tube can be used to examine your colon directly (sigmoidoscopy or colonoscopy). This is done to check for the earliest forms of colorectal  cancer.  Routine screening usually begins at age 50.  Direct examination of the colon should be repeated every 5-10 years through 60 years of age. However, you may need to be screened more often if early forms of precancerous polyps or small growths are found. Skin Cancer  Check your skin from head to toe regularly.  Tell your health care provider about any new moles or changes in moles, especially if there is a change in a mole's shape or color.  Also tell your health care provider if you have a mole that is larger than the size of a pencil eraser.  Always use sunscreen. Apply sunscreen liberally and repeatedly throughout the day.  Protect yourself by wearing long sleeves, pants, a wide-brimmed hat, and sunglasses whenever you are outside. HEART DISEASE, DIABETES, AND HIGH BLOOD PRESSURE   High blood pressure causes heart disease and increases the risk of stroke. High blood pressure is more likely to develop in:  People who have blood pressure in the high end   of the normal range (130-139/85-89 mm Hg).  People who are overweight or obese.  People who are African American.  If you are 38-23 years of age, have your blood pressure checked every 3-5 years. If you are 61 years of age or older, have your blood pressure checked every year. You should have your blood pressure measured twice--once when you are at a hospital or clinic, and once when you are not at a hospital or clinic. Record the average of the two measurements. To check your blood pressure when you are not at a hospital or clinic, you can use:  An automated blood pressure machine at a pharmacy.  A home blood pressure monitor.  If you are between 45 years and 39 years old, ask your health care provider if you should take aspirin to prevent strokes.  Have regular diabetes screenings. This involves taking a blood sample to check your fasting blood sugar level.  If you are at a normal weight and have a low risk for diabetes,  have this test once every three years after 60 years of age.  If you are overweight and have a high risk for diabetes, consider being tested at a younger age or more often. PREVENTING INFECTION  Hepatitis B  If you have a higher risk for hepatitis B, you should be screened for this virus. You are considered at high risk for hepatitis B if:  You were born in a country where hepatitis B is common. Ask your health care provider which countries are considered high risk.  Your parents were born in a high-risk country, and you have not been immunized against hepatitis B (hepatitis B vaccine).  You have HIV or AIDS.  You use needles to inject street drugs.  You live with someone who has hepatitis B.  You have had sex with someone who has hepatitis B.  You get hemodialysis treatment.  You take certain medicines for conditions, including cancer, organ transplantation, and autoimmune conditions. Hepatitis C  Blood testing is recommended for:  Everyone born from [birth year] through 1965.  Anyone with known risk factors for hepatitis C. Sexually transmitted infections (STIs)  You should be screened for sexually transmitted infections (STIs) including gonorrhea and chlamydia if:  You are sexually active and are younger than 60 years of age.  You are older than 60 years of age and your health care provider tells you that you are at risk for this type of infection.  Your sexual activity has changed since you were last screened and you are at an increased risk for chlamydia or gonorrhea. Ask your health care provider if you are at risk.  If you do not have HIV, but are at risk, it may be recommended that you take a prescription medicine daily to prevent HIV infection. This is called pre-exposure prophylaxis (PrEP). You are considered at risk if:  You are sexually active and do not regularly use condoms or know the HIV status of your partner(s).  You take drugs by injection.  You are sexually  active with a partner who has HIV. Talk with your health care provider about whether you are at high risk of being infected with HIV. If you choose to begin PrEP, you should first be tested for HIV. You should then be tested every 3 months for as long as you are taking PrEP.  PREGNANCY   If you are premenopausal and you may become pregnant, ask your health care provider about preconception counseling.  If you may  become pregnant, take 400 to 800 micrograms (mcg) of folic acid every day.  If you want to prevent pregnancy, talk to your health care provider about birth control (contraception). OSTEOPOROSIS AND MENOPAUSE   Osteoporosis is a disease in which the bones lose minerals and strength with aging. This can result in serious bone fractures. Your risk for osteoporosis can be identified using a bone density scan.  If you are 61 years of age or older, or if you are at risk for osteoporosis and fractures, ask your health care provider if you should be screened.  Ask your health care provider whether you should take a calcium or vitamin D supplement to lower your risk for osteoporosis.  Menopause may have certain physical symptoms and risks.  Hormone replacement therapy may reduce some of these symptoms and risks. Talk to your health care provider about whether hormone replacement therapy is right for you.  HOME CARE INSTRUCTIONS   Schedule regular health, dental, and eye exams.  Stay current with your immunizations.   Do not use any tobacco products including cigarettes, chewing tobacco, or electronic cigarettes.  If you are pregnant, do not drink alcohol.  If you are breastfeeding, limit how much and how often you drink alcohol.  Limit alcohol intake to no more than 1 drink per day for nonpregnant women. One drink equals 12 ounces of beer, 5 ounces of wine, or 1 ounces of hard liquor.  Do not use street drugs.  Do not share needles.  Ask your health care provider for help if  you need support or information about quitting drugs.  Tell your health care provider if you often feel depressed.  Tell your health care provider if you have ever been abused or do not feel safe at home.   This information is not intended to replace advice given to you by your health care provider. Make sure you discuss any questions you have with your health care provider.   Document Released: 04/23/2011 Document Revised: 10/29/2014 Document Reviewed: 09/09/2013 Elsevier Interactive Patient Education [employer].

## 2016-08-08 ENCOUNTER — Encounter: Payer: Commercial Managed Care - HMO | Admitting: Physician Assistant

## 2016-08-09 ENCOUNTER — Ambulatory Visit (INDEPENDENT_AMBULATORY_CARE_PROVIDER_SITE_OTHER): Payer: 59 | Admitting: General Surgery

## 2016-08-09 ENCOUNTER — Encounter: Payer: Self-pay | Admitting: General Surgery

## 2016-08-09 VITALS — BP 122/58 | HR 92 | Resp 12 | Ht 67.0 in | Wt 116.0 lb

## 2016-08-09 DIAGNOSIS — Z1211 Encounter for screening for malignant neoplasm of colon: Secondary | ICD-10-CM

## 2016-08-09 MED ORDER — POLYETHYLENE GLYCOL 3350 17 GM/SCOOP PO POWD: ORAL | 0 refills | Status: DC

## 2016-08-09 NOTE — Patient Instructions (Signed)
Colonoscopy A colonoscopy is an exam to look at the entire large intestine (colon). This exam can help find problems such as tumors, polyps, inflammation, and areas of bleeding. The exam takes about 1 hour.  LET YOUR HEALTH CARE PROVIDER KNOW ABOUT:   Any allergies you have.  All medicines you are taking, including vitamins, herbs, eye drops, creams, and over-the-counter medicines.  Previous problems you or members of your family have had with the use of anesthetics.  Any blood disorders you have.  Previous surgeries you have had.  Medical conditions you have. RISKS AND COMPLICATIONS  Generally, this is a safe procedure. However, as with any procedure, complications can occur. Possible complications include:  Bleeding.  Tearing or rupture of the colon wall.  Reaction to medicines given during the exam.  Infection (rare). BEFORE THE PROCEDURE   Ask your health care provider about changing or stopping your regular medicines.  You may be prescribed an oral bowel prep. This involves drinking a large amount of medicated liquid, starting the day before your procedure. The liquid will cause you to have multiple loose stools until your stool is almost clear or light green. This cleans out your colon in preparation for the procedure.  Do not eat or drink anything else once you have started the bowel prep, unless your health care provider tells you it is safe to do so.  Arrange for someone to drive you home after the procedure. PROCEDURE   You will be given medicine to help you relax (sedative).  You will lie on your side with your knees bent.  A long, flexible tube with a light and camera on the end (colonoscope) will be inserted through the rectum and into the colon. The camera sends video back to a computer screen as it moves through the colon. The colonoscope also releases carbon dioxide gas to inflate the colon. This helps your health care provider see the area better.  During  the exam, your health care provider may take a small tissue sample (biopsy) to be examined under a microscope if any abnormalities are found.  The exam is finished when the entire colon has been viewed. AFTER THE PROCEDURE   Do not drive for 24 hours after the exam.  You may have a small amount of blood in your stool.  You may pass moderate amounts of gas and have mild abdominal cramping or bloating. This is caused by the gas used to inflate your colon during the exam.  Ask when your test results will be ready and how you will get your results. Make sure you get your test results.   This information is not intended to replace advice given to you by your health care provider. Make sure you discuss any questions you have with your health care provider.   Document Released: 10/05/2000 Document Revised: 07/29/2013 Document Reviewed: 06/15/2013 Elsevier Interactive Patient Education 2016 [employer]  

## 2016-08-09 NOTE — Progress Notes (Signed)
Patient ID: Christina Williams, female   DOB: 1956-04-13, 60 y.o.   MRN: [id number]  Chief Complaint  Patient presents with  . Colonoscopy    HPI [name] is a 60 y.o. female here today for a evaluation of a colonoscopy. Last colonoscopy was done in 2007. Patient states no GI problems at this time.  I have reviewed the history of present illness with the patient.  HPI  Past Medical History:  Diagnosis Date  . Diverticulosis 2007  . GERD (gastroesophageal reflux disease)   . Hyperlipidemia     Past Surgical History:  Procedure Laterality Date  . ABDOMINAL HYSTERECTOMY  2005  . APPENDECTOMY  1999  . COLONOSCOPY  2007  . LAPAROSCOPY  1986  . TONSILLECTOMY  1984  . UPPER GASTROINTESTINAL ENDOSCOPY  04/03/2004   Normal    Family History  Problem Relation Age of Onset  . Cancer Mother     brain tumor  . CAD Mother   . Hypertension Mother   . Diabetes Mother     type 2  . CAD Father   . GER disease Father   . Hypertension Sister   . Hyperlipidemia Sister   . Diabetes Sister     type 2  . Cancer Maternal Aunt     breast    Social History Social History  Substance Use Topics  . Smoking status: Never Smoker  . Smokeless tobacco: Never Used  . Alcohol use Yes     Comment: Ocassional    No Known Allergies  Current Outpatient Prescriptions  Medication Sig Dispense Refill  . FLUVIRIN SUSP ADM 0.5ML IM UTD  0  . hyoscyamine (LEVBID) 0.375 MG 12 hr tablet Take 1 tablet (0.375 mg total) by mouth 2 (two) times daily. 60 tablet 5  . Lactobacillus (ACIDOPHILUS PO) Take 1 tablet by mouth daily.    . polycarbophil (FIBERCON) 625 MG tablet Take 3 tablets by mouth daily.    . simvastatin (ZOCOR) 20 MG tablet Take 1 tablet by mouth at  bedtime 90 tablet 3  . polyethylene glycol powder (GLYCOLAX/MIRALAX) powder 255 grams one bottle for colonoscopy prep 255 g 0   No current facility-administered medications for this visit.     Review of Systems Review of Systems   Constitutional: Negative.   Respiratory: Negative.   Cardiovascular: Negative.   Gastrointestinal: Negative.     Blood pressure (!) 122/58, pulse 92, resp. rate 12, height 5\' 7"  (1.702 m), weight 116 lb (52.6 kg).  Physical Exam Physical Exam  Constitutional: She is oriented to person, place, and time. She appears well-developed and well-nourished.  Eyes: Conjunctivae are normal. No scleral icterus.  Neck: Neck supple.  Cardiovascular: Normal rate and normal heart sounds.   Pulmonary/Chest: Effort normal and breath sounds normal.  Abdominal: Soft. Bowel sounds are normal. There is no tenderness.  Lymphadenopathy:    She has no cervical adenopathy.  Neurological: She is alert and oriented to person, place, and time.  Skin: Skin is warm and dry.    Data Reviewed Prior colonoscopy reviewed-diverticulosis   Assessment    Stable exam. Due for screening colonoscopy     Plan        Colonoscopy with possible biopsy/polypectomy prn: Information regarding the procedure, including its potential risks and complications (including but not limited to perforation of the bowel, which may require emergency surgery to repair, and bleeding) was verbally given to the patient. Educational information regarding lower intestinal endoscopy was given to the patient. Written  instructions for how to complete the bowel prep using Miralax were provided. The importance of drinking ample fluids to avoid dehydration as a result of the prep emphasized.  Patient has been scheduled for a colonoscopy on 09-05-16 at [facility].   This information has been scribed by Gaspar Cola CMA.   SANKAR,SEEPLAPUTHUR G 08/09/2016, 2:43 PM

## 2016-08-11 LAB — COMPREHENSIVE METABOLIC PANEL
ALT: 18 IU/L (ref 0–32)
AST: 20 IU/L (ref 0–40)
Albumin/Globulin Ratio: 2 (ref 1.2–2.2)
Albumin: 4.5 g/dL (ref 3.6–4.8)
Alkaline Phosphatase: 68 IU/L (ref 39–117)
BILIRUBIN TOTAL: 0.8 mg/dL (ref 0.0–1.2)
BUN/Creatinine Ratio: 12 (ref 12–28)
BUN: 10 mg/dL (ref 8–27)
CHLORIDE: 100 mmol/L (ref 96–106)
CO2: 27 mmol/L (ref 18–29)
Calcium: 9.4 mg/dL (ref 8.7–10.3)
Creatinine, Ser: 0.84 mg/dL (ref 0.57–1.00)
GFR calc non Af Amer: 76 mL/min/{1.73_m2} (ref >59)
GFR, EST AFRICAN AMERICAN: 87 mL/min/{1.73_m2} (ref >59)
GLUCOSE: 92 mg/dL (ref 65–99)
Globulin, Total: 2.3 g/dL (ref 1.5–4.5)
Potassium: 4.2 mmol/L (ref 3.5–5.2)
Sodium: 140 mmol/L (ref 134–144)
TOTAL PROTEIN: 6.8 g/dL (ref 6.0–8.5)

## 2016-08-11 LAB — CBC WITH DIFFERENTIAL/PLATELET
BASOS ABS: 0 10*3/uL (ref 0.0–0.2)
Basos: 1 %
EOS (ABSOLUTE): 0.2 10*3/uL (ref 0.0–0.4)
EOS: 3 %
Hematocrit: 43 % (ref 34.0–46.6)
Hemoglobin: 14.3 g/dL (ref 11.1–15.9)
IMMATURE GRANS (ABS): 0 10*3/uL (ref 0.0–0.1)
IMMATURE GRANULOCYTES: 0 %
Lymphocytes Absolute: 2 10*3/uL (ref 0.7–3.1)
Lymphs: 39 %
MCH: 31.5 pg (ref 26.6–33.0)
MCHC: 33.3 g/dL (ref 31.5–35.7)
MCV: 95 fL (ref 79–97)
MONOCYTES: 10 %
Monocytes Absolute: 0.5 10*3/uL (ref 0.1–0.9)
NEUTROS ABS: 2.5 10*3/uL (ref 1.4–7.0)
Neutrophils: 47 %
PLATELETS: 209 10*3/uL (ref 150–379)
RBC: 4.54 x10E6/uL (ref 3.77–5.28)
RDW: 13.4 % (ref 12.3–15.4)
WBC: 5.2 10*3/uL (ref 3.4–10.8)

## 2016-08-11 LAB — LIPID PANEL
CHOLESTEROL TOTAL: 199 mg/dL (ref 100–199)
Chol/HDL Ratio: 3.1 ratio units (ref 0.0–4.4)
HDL: 64 mg/dL (ref >39)
LDL Calculated: 118 mg/dL — ABNORMAL HIGH (ref 0–99)
TRIGLYCERIDES: 87 mg/dL (ref 0–149)
VLDL Cholesterol Cal: 17 mg/dL (ref 5–40)

## 2016-08-11 LAB — HEMOGLOBIN A1C
Est. average glucose Bld gHb Est-mCnc: 117 mg/dL
HEMOGLOBIN A1C: 5.7 % — AB (ref 4.8–5.6)

## 2016-08-11 LAB — TSH: TSH: 2.88 u[IU]/mL (ref 0.450–4.500)

## 2016-08-11 LAB — HEPATITIS C ANTIBODY

## 2016-08-11 LAB — MAGNESIUM: Magnesium: 2.2 mg/dL (ref 1.6–2.3)

## 2016-08-13 ENCOUNTER — Telehealth: Payer: Self-pay

## 2016-08-13 NOTE — Telephone Encounter (Signed)
Pt reports she has seen results on mychart. sd

## 2016-08-13 NOTE — Telephone Encounter (Signed)
-----   Message from Mar Daring, [place] sent at 08/12/2016  4:18 PM EDT ----- All labs are within normal limits and stable.  Cholesterol has improved from previous labs. HgBA1c (3 month snapshot of blood sugar) is only borderline elevated at 5.7. Normal high is 5.6. Will continue to monitor yearly. Magnesium and potassium were normal. May consider adding CoQ10 supplement as we discussed to see if this may help the night cramps. Thanks! -JB

## 2016-08-23 ENCOUNTER — Other Ambulatory Visit: Payer: Self-pay | Admitting: General Surgery

## 2016-09-04 ENCOUNTER — Encounter: Payer: Self-pay | Admitting: *Deleted

## 2016-09-05 ENCOUNTER — Ambulatory Visit: Payer: Commercial Managed Care - HMO | Admitting: Anesthesiology

## 2016-09-05 ENCOUNTER — Ambulatory Visit
Admission: RE | Admit: 2016-09-05 | Discharge: 2016-09-05 | Disposition: A | Discharge status: Home / Self Care | Payer: Commercial Managed Care - HMO | Source: Ambulatory Visit | Attending: General Surgery | Admitting: General Surgery

## 2016-09-05 ENCOUNTER — Encounter
Admission: RE | Disposition: A | Discharge status: Home / Self Care | Payer: Self-pay | Source: Ambulatory Visit | Attending: General Surgery

## 2016-09-05 DIAGNOSIS — K5730 Diverticulosis of large intestine without perforation or abscess without bleeding: Secondary | ICD-10-CM | POA: Insufficient documentation

## 2016-09-05 DIAGNOSIS — Z79899 Other long term (current) drug therapy: Secondary | ICD-10-CM | POA: Diagnosis not present

## 2016-09-05 DIAGNOSIS — E785 Hyperlipidemia, unspecified: Secondary | ICD-10-CM | POA: Diagnosis not present

## 2016-09-05 DIAGNOSIS — K219 Gastro-esophageal reflux disease without esophagitis: Secondary | ICD-10-CM | POA: Diagnosis not present

## 2016-09-05 DIAGNOSIS — Z1211 Encounter for screening for malignant neoplasm of colon: Secondary | ICD-10-CM | POA: Diagnosis not present

## 2016-09-05 HISTORY — PX: COLONOSCOPY WITH PROPOFOL: SHX5780

## 2016-09-05 SURGERY — COLONOSCOPY WITH PROPOFOL
Anesthesia: General

## 2016-09-05 MED ORDER — SODIUM CHLORIDE 0.9 % IV SOLN
INTRAVENOUS | Status: DC
  Administered 2016-09-05 (×2): via INTRAVENOUS

## 2016-09-05 MED ORDER — LIDOCAINE HCL (CARDIAC) 20 MG/ML IV SOLN
INTRAVENOUS | Status: DC | PRN
  Administered 2016-09-05: 60 mg via INTRAVENOUS

## 2016-09-05 MED ORDER — PROPOFOL 10 MG/ML IV BOLUS
INTRAVENOUS | Status: DC | PRN
  Administered 2016-09-05: 50 mg via INTRAVENOUS

## 2016-09-05 MED ORDER — PROPOFOL 500 MG/50ML IV EMUL
INTRAVENOUS | Status: DC | PRN
  Administered 2016-09-05: 140 ug/kg/min via INTRAVENOUS

## 2016-09-05 NOTE — Anesthesia Preprocedure Evaluation (Signed)
Anesthesia Evaluation  Patient identified by MRN, date of birth, ID band Patient awake    Reviewed: Allergy & Precautions, NPO status , Patient's Chart, lab work & pertinent test results  Airway Mallampati: II       Dental no notable dental hx.    Pulmonary neg pulmonary ROS,    Pulmonary exam normal        Cardiovascular negative cardio ROS Normal cardiovascular exam     Neuro/Psych negative neurological ROS  negative psych ROS   GI/Hepatic Neg liver ROS, GERD  Medicated,Diverticulosis Hx   Endo/Other  negative endocrine ROS  Renal/GU negative Renal ROS  negative genitourinary   Musculoskeletal negative musculoskeletal ROS (+)   Abdominal Normal abdominal exam  (+)   Peds negative pediatric ROS (+)  Hematology negative hematology ROS (+)   Anesthesia Other Findings Past Medical History: 2007: Diverticulosis No date: GERD (gastroesophageal reflux disease) No date: Hyperlipidemia  Reproductive/Obstetrics                             Anesthesia Physical Anesthesia Plan  ASA: II  Anesthesia Plan: General   Post-op Pain Management:    Induction: Intravenous  Airway Management Planned: Nasal Cannula  Additional Equipment:   Intra-op Plan:   Post-operative Plan:   Informed Consent: I have reviewed the patients History and Physical, chart, labs and discussed the procedure including the risks, benefits and alternatives for the proposed anesthesia with the patient or authorized representative who has indicated his/her understanding and acceptance.   Dental advisory given  Plan Discussed with: CRNA and Surgeon  Anesthesia Plan Comments:         Anesthesia Quick Evaluation

## 2016-09-05 NOTE — H&P (View-Only) (Signed)
Patient ID: Christina Williams, female   DOB: 12/05/1955, 60 y.o.   MRN: [id number]  Chief Complaint  Patient presents with  . Colonoscopy    HPI Christina Williams is a 60 y.o. female here today for a evaluation of a colonoscopy. Last colonoscopy was done in 2007. Patient states no GI problems at this time.  I have reviewed the history of present illness with the patient.  HPI  Past Medical History:  Diagnosis Date  . Diverticulosis 2007  . GERD (gastroesophageal reflux disease)   . Hyperlipidemia     Past Surgical History:  Procedure Laterality Date  . ABDOMINAL HYSTERECTOMY  2005  . APPENDECTOMY  1999  . COLONOSCOPY  2007  . LAPAROSCOPY  1986  . TONSILLECTOMY  1984  . UPPER GASTROINTESTINAL ENDOSCOPY  04/03/2004   Normal    Family History  Problem Relation Age of Onset  . Cancer Mother     brain tumor  . CAD Mother   . Hypertension Mother   . Diabetes Mother     type 2  . CAD Father   . GER disease Father   . Hypertension Sister   . Hyperlipidemia Sister   . Diabetes Sister     type 2  . Cancer Maternal Aunt     breast    Social History Social History  Substance Use Topics  . Smoking status: Never Smoker  . Smokeless tobacco: Never Used  . Alcohol use Yes     Comment: Ocassional    No Known Allergies  Current Outpatient Prescriptions  Medication Sig Dispense Refill  . FLUVIRIN SUSP ADM 0.5ML IM UTD  0  . hyoscyamine (LEVBID) 0.375 MG 12 hr tablet Take 1 tablet (0.375 mg total) by mouth 2 (two) times daily. 60 tablet 5  . Lactobacillus (ACIDOPHILUS PO) Take 1 tablet by mouth daily.    . polycarbophil (FIBERCON) 625 MG tablet Take 3 tablets by mouth daily.    . simvastatin (ZOCOR) 20 MG tablet Take 1 tablet by mouth at  bedtime 90 tablet 3  . polyethylene glycol powder (GLYCOLAX/MIRALAX) powder 255 grams one bottle for colonoscopy prep 255 g 0   No current facility-administered medications for this visit.     Review of Systems Review of Systems   Constitutional: Negative.   Respiratory: Negative.   Cardiovascular: Negative.   Gastrointestinal: Negative.     Blood pressure (!) 122/58, pulse 92, resp. rate 12, height 5\' 7"  (1.702 m), weight 116 lb (52.6 kg).  Physical Exam Physical Exam  Constitutional: She is oriented to person, place, and time. She appears well-developed and well-nourished.  Eyes: Conjunctivae are normal. No scleral icterus.  Neck: Neck supple.  Cardiovascular: Normal rate and normal heart sounds.   Pulmonary/Chest: Effort normal and breath sounds normal.  Abdominal: Soft. Bowel sounds are normal. There is no tenderness.  Lymphadenopathy:    She has no cervical adenopathy.  Neurological: She is alert and oriented to person, place, and time.  Skin: Skin is warm and dry.    Data Reviewed Prior colonoscopy reviewed-diverticulosis   Assessment    Stable exam. Due for screening colonoscopy     Plan        Colonoscopy with possible biopsy/polypectomy prn: Information regarding the procedure, including its potential risks and complications (including but not limited to perforation of the bowel, which may require emergency surgery to repair, and bleeding) was verbally given to the patient. Educational information regarding lower intestinal endoscopy was given to the patient. Written  instructions for how to complete the bowel prep using Miralax were provided. The importance of drinking ample fluids to avoid dehydration as a result of the prep emphasized.  Patient has been scheduled for a colonoscopy on 09-05-16 at [facility].   This information has been scribed by Gaspar Cola CMA.   Param Capri G 08/09/2016, 2:43 PM

## 2016-09-05 NOTE — Anesthesia Postprocedure Evaluation (Signed)
Anesthesia Post Note  Patient: Christina Williams  Procedure(s) Performed: Procedure(s) (LRB): COLONOSCOPY WITH PROPOFOL (N/A)  Patient location during evaluation: PACU Anesthesia Type: General Level of consciousness: awake and alert and oriented Pain management: pain level controlled Vital Signs Assessment: post-procedure vital signs reviewed and stable Respiratory status: spontaneous breathing Cardiovascular status: blood pressure returned to baseline Anesthetic complications: no    Last Vitals:  Vitals:   09/05/16 0925 09/05/16 0935  BP: 130/87 (!) 144/70  Pulse: 76 76  Resp: 11 14  Temp:  36.3 C    Last Pain:  Vitals:   09/05/16 0935  TempSrc: Tympanic                 Anibal Quinby

## 2016-09-05 NOTE — Transfer of Care (Signed)
Immediate Anesthesia Transfer of Care Note  Patient: Christina Williams  Procedure(s) Performed: Procedure(s): COLONOSCOPY WITH PROPOFOL (N/A)  Patient Location: Endoscopy Unit  Anesthesia Type:General  Level of Consciousness: awake, alert , oriented and patient cooperative  Airway & Oxygen Therapy: Patient Spontanous Breathing and Patient connected to nasal cannula oxygen  Post-op Assessment: Report given to RN, Post -op Vital signs reviewed and stable and Patient moving all extremities X 4  Post vital signs: Reviewed and stable  Last Vitals:  Vitals:   09/05/16 0826 09/05/16 0905  BP: 128/73   Pulse: 97   Resp: 14   Temp: 36.5 C 36.1 C    Last Pain:  Vitals:   09/05/16 0905  TempSrc: Tympanic         Complications: No apparent anesthesia complications

## 2016-09-05 NOTE — Op Note (Signed)
[facility]astroenter[facility] Patient Name: Christina Williams Procedure Date: 09/05/2016 8:31 AM MRN: [id number] Account #: [id number] Date of Birth: 10/15/1956 Admit Type: Outpatient Age: 60 Room: [facility] ENDO ROOM 1 Gender: Female Note Status: Finalized Procedure:            Colonoscopy Indications:          Screening for colorectal malignant neoplasm Providers:            Exa Bomba G. Jamal Collin, MD Referring MD:         Mar Daring (Referring MD) Medicines:            General Anesthesia Complications:        No immediate complications. Procedure:            Pre-Anesthesia Assessment:                       - General anesthesia under the supervision of an                        anesthesiologist was determined to be medically                        necessary for this procedure based on review of the                        patient's medical history, medications, and prior                        anesthesia history.                       After obtaining informed consent, the colonoscope was                        passed under direct vision. Throughout the procedure,                        the patient's blood pressure, pulse, and oxygen                        saturations were monitored continuously. The                        Colonoscope was introduced through the anus and                        advanced to the the cecum, identified by the ileocecal                        valve. The colonoscopy was performed without                        difficulty. The patient tolerated the procedure well.                        The quality of the bowel preparation was excellent. Findings:      The perianal and digital rectal examinations were normal.      The entire examined colon appeared normal. Impression:           - The entire examined colon is normal.                       -  No specimens collected. Recommendation:       - Discharge patient to home.                       -  Resume previous diet.                       - Continue present medications.                       - Repeat colonoscopy in 10 years for screening purposes. Procedure Code(s):    --- Professional ---                       [phone], Colonoscopy, flexible; diagnostic, including                        collection of specimen(s) by brushing or washing, when                        performed (separate procedure) Diagnosis Code(s):    --- Professional ---                       Z12.11, Encounter for screening for malignant neoplasm                        of colon CPT copyright 2016 American Medical Association. All rights reserved. The codes documented in this report are preliminary and upon coder review may  be revised to meet current compliance requirements. Christene Lye, MD 09/05/2016 9:04:14 AM This report has been signed electronically. Number of Addenda: 0 Note Initiated On: 09/05/2016 8:31 AM Scope Withdrawal Time: 0 hours 5 minutes 17 seconds  Total Procedure Duration: 0 hours 22 minutes 8 seconds       [facility]

## 2016-09-05 NOTE — Interval H&P Note (Signed)
History and Physical Interval Note:  09/05/2016 8:23 AM  Melissa Noon  has presented today for surgery, with the diagnosis of SCREENING  The various methods of treatment have been discussed with the patient and family. After consideration of risks, benefits and other options for treatment, the patient has consented to  Procedure(s): COLONOSCOPY WITH PROPOFOL (N/A) as a surgical intervention .  The patient's history has been reviewed, patient examined, no change in status, stable for surgery.  I have reviewed the patient's chart and labs.  Questions were answered to the patient's satisfaction.     Drevion Offord G

## 2016-09-06 ENCOUNTER — Encounter: Payer: Self-pay | Admitting: General Surgery

## 2016-09-18 ENCOUNTER — Ambulatory Visit: Payer: Commercial Managed Care - HMO | Admitting: Anesthesiology

## 2016-09-18 ENCOUNTER — Encounter
Admission: RE | Disposition: A | Discharge status: Home / Self Care | Payer: Self-pay | Source: Ambulatory Visit | Attending: Ophthalmology

## 2016-09-18 ENCOUNTER — Encounter: Payer: Self-pay | Admitting: *Deleted

## 2016-09-18 ENCOUNTER — Ambulatory Visit
Admission: RE | Admit: 2016-09-18 | Discharge: 2016-09-18 | Disposition: A | Discharge status: Home / Self Care | Payer: Commercial Managed Care - HMO | Source: Ambulatory Visit | Attending: Ophthalmology | Admitting: Ophthalmology

## 2016-09-18 DIAGNOSIS — E785 Hyperlipidemia, unspecified: Secondary | ICD-10-CM | POA: Diagnosis not present

## 2016-09-18 DIAGNOSIS — H2511 Age-related nuclear cataract, right eye: Secondary | ICD-10-CM | POA: Diagnosis not present

## 2016-09-18 DIAGNOSIS — K219 Gastro-esophageal reflux disease without esophagitis: Secondary | ICD-10-CM | POA: Diagnosis not present

## 2016-09-18 DIAGNOSIS — Z79899 Other long term (current) drug therapy: Secondary | ICD-10-CM | POA: Insufficient documentation

## 2016-09-18 DIAGNOSIS — K5730 Diverticulosis of large intestine without perforation or abscess without bleeding: Secondary | ICD-10-CM | POA: Insufficient documentation

## 2016-09-18 HISTORY — DX: Irritable bowel syndrome, unspecified: K58.9

## 2016-09-18 HISTORY — PX: CATARACT EXTRACTION W/PHACO: SHX586

## 2016-09-18 SURGERY — PHACOEMULSIFICATION, CATARACT, WITH IOL INSERTION
Anesthesia: Monitor Anesthesia Care | Site: Eye | Laterality: Right | Wound class: Clean

## 2016-09-18 MED ORDER — FENTANYL CITRATE (PF) 100 MCG/2ML IJ SOLN
INTRAMUSCULAR | Status: DC | PRN
  Administered 2016-09-18 (×2): 25 ug via INTRAVENOUS

## 2016-09-18 MED ORDER — MOXIFLOXACIN HCL 0.5 % OP SOLN
OPHTHALMIC | Status: DC | PRN
  Administered 2016-09-18: 1 [drp] via OPHTHALMIC

## 2016-09-18 MED ORDER — MOXIFLOXACIN HCL 0.5 % OP SOLN
1.0000 [drp] | OPHTHALMIC | Status: DC | PRN
  Administered 2016-09-18 (×3): 1 [drp] via OPHTHALMIC

## 2016-09-18 MED ORDER — EPINEPHRINE PF 1 MG/ML IJ SOLN
INTRAOCULAR | Status: DC | PRN
  Administered 2016-09-18: 1 mL via OPHTHALMIC

## 2016-09-18 MED ORDER — SODIUM CHLORIDE 0.9 % IV SOLN
INTRAVENOUS | Status: DC
  Administered 2016-09-18: 10:00:00 via INTRAVENOUS

## 2016-09-18 MED ORDER — ARMC OPHTHALMIC DILATING DROPS
1.0000 "application " | OPHTHALMIC | Status: DC | PRN
  Administered 2016-09-18 (×3): 1 via OPHTHALMIC

## 2016-09-18 MED ORDER — MIDAZOLAM HCL 2 MG/2ML IJ SOLN
INTRAMUSCULAR | Status: DC | PRN
  Administered 2016-09-18 (×2): 1 mg via INTRAVENOUS

## 2016-09-18 MED ORDER — LIDOCAINE HCL (PF) 4 % IJ SOLN
INTRAOCULAR | Status: DC | PRN
  Administered 2016-09-18: 4 mL via OPHTHALMIC

## 2016-09-18 MED ORDER — CARBACHOL 0.01 % IO SOLN
INTRAOCULAR | Status: DC | PRN
  Administered 2016-09-18: 0.5 mL via INTRAOCULAR

## 2016-09-18 MED ORDER — EPINEPHRINE PF 1 MG/ML IJ SOLN
INTRAMUSCULAR | Status: AC
  Filled 2016-09-18: qty 2

## 2016-09-18 MED ORDER — LIDOCAINE HCL (PF) 4 % IJ SOLN
INTRAMUSCULAR | Status: AC
  Filled 2016-09-18: qty 5

## 2016-09-18 MED ORDER — POVIDONE-IODINE 5 % OP SOLN
OPHTHALMIC | Status: AC
  Filled 2016-09-18: qty 30

## 2016-09-18 MED ORDER — POVIDONE-IODINE 5 % OP SOLN
OPHTHALMIC | Status: DC | PRN
  Administered 2016-09-18: 1 via OPHTHALMIC

## 2016-09-18 MED ORDER — NA CHONDROIT SULF-NA HYALURON 40-17 MG/ML IO SOLN
INTRAOCULAR | Status: DC | PRN
  Administered 2016-09-18: 1 mL via INTRAOCULAR

## 2016-09-18 MED ORDER — ARMC OPHTHALMIC DILATING DROPS
OPHTHALMIC | Status: AC
  Administered 2016-09-18: 1 via OPHTHALMIC
  Filled 2016-09-18: qty 0.4

## 2016-09-18 MED ORDER — NA CHONDROIT SULF-NA HYALURON 40-17 MG/ML IO SOLN
INTRAOCULAR | Status: AC
  Filled 2016-09-18: qty 1

## 2016-09-18 MED ORDER — MOXIFLOXACIN HCL 0.5 % OP SOLN
OPHTHALMIC | Status: AC
  Administered 2016-09-18: 1 [drp] via OPHTHALMIC
  Filled 2016-09-18: qty 3

## 2016-09-18 SURGICAL SUPPLY — 21 items
CANNULA ANT/CHMB 27GA (MISCELLANEOUS) ×2 IMPLANT
CUP MEDICINE 2OZ PLAST GRAD ST (MISCELLANEOUS) ×2 IMPLANT
GLOVE BIO SURGEON STRL SZ8 (GLOVE) ×2 IMPLANT
GLOVE BIOGEL M 6.5 STRL (GLOVE) ×2 IMPLANT
GLOVE SURG LX 8.0 MICRO (GLOVE) ×1
GLOVE SURG LX STRL 8.0 MICRO (GLOVE) ×1 IMPLANT
GOWN STRL REUS W/ TWL LRG LVL3 (GOWN DISPOSABLE) ×2 IMPLANT
GOWN STRL REUS W/TWL LRG LVL3 (GOWN DISPOSABLE) ×2
LENS IOL TECNIS ITEC 11.0 (Intraocular Lens) ×2 IMPLANT
PACK CATARACT (MISCELLANEOUS) ×2 IMPLANT
PACK CATARACT BRASINGTON LX (MISCELLANEOUS) ×2 IMPLANT
PACK EYE AFTER SURG (MISCELLANEOUS) ×2 IMPLANT
SOL BSS BAG (MISCELLANEOUS) ×2
SOL PREP PVP 2OZ (MISCELLANEOUS) ×2
SOLUTION BSS BAG (MISCELLANEOUS) ×1 IMPLANT
SOLUTION PREP PVP 2OZ (MISCELLANEOUS) ×1 IMPLANT
SYR 3ML LL SCALE MARK (SYRINGE) ×2 IMPLANT
SYR 5ML LL (SYRINGE) ×2 IMPLANT
SYR TB 1ML 27GX1/2 LL (SYRINGE) ×2 IMPLANT
WATER STERILE IRR 250ML POUR (IV SOLUTION) ×2 IMPLANT
WIPE NON LINTING 3.25X3.25 (MISCELLANEOUS) ×2 IMPLANT

## 2016-09-18 NOTE — Anesthesia Preprocedure Evaluation (Signed)
Anesthesia Evaluation  Patient identified by MRN, date of birth, ID band Patient awake    Reviewed: Allergy & Precautions, NPO status , Patient's Chart, lab work & pertinent test results  History of Anesthesia Complications Negative for: history of anesthetic complications  Airway Mallampati: II       Dental no notable dental hx.    Pulmonary neg pulmonary ROS,    Pulmonary exam normal        Cardiovascular negative cardio ROS Normal cardiovascular exam     Neuro/Psych negative neurological ROS  negative psych ROS   GI/Hepatic Neg liver ROS, GERD  Medicated,Diverticulosis Hx   Endo/Other  negative endocrine ROS  Renal/GU negative Renal ROS  negative genitourinary   Musculoskeletal negative musculoskeletal ROS (+)   Abdominal Normal abdominal exam  (+)   Peds negative pediatric ROS (+)  Hematology negative hematology ROS (+)   Anesthesia Other Findings Past Medical History: 2007: Diverticulosis No date: GERD (gastroesophageal reflux disease) No date: Hyperlipidemia  Reproductive/Obstetrics                             Anesthesia Physical  Anesthesia Plan  ASA: II  Anesthesia Plan: MAC   Post-op Pain Management:    Induction: Intravenous  Airway Management Planned: Nasal Cannula  Additional Equipment:   Intra-op Plan:   Post-operative Plan:   Informed Consent: I have reviewed the patients History and Physical, chart, labs and discussed the procedure including the risks, benefits and alternatives for the proposed anesthesia with the patient or authorized representative who has indicated his/her understanding and acceptance.   Dental advisory given  Plan Discussed with: CRNA and Surgeon  Anesthesia Plan Comments:         Anesthesia Quick Evaluation

## 2016-09-18 NOTE — Discharge Instructions (Signed)
Eye Surgery Discharge Instructions  Expect mild scratchy sensation or mild soreness. DO NOT RUB YOUR EYE!  The day of surgery:  Minimal physical activity, but bed rest is not required  No reading, computer work, or close hand work  No bending, lifting, or straining.  May watch TV  For 24 hours:  No driving, legal decisions, or alcoholic beverages  Safety precautions  Eat anything you prefer: It is better to start with liquids, then soup then solid foods.  _____ Eye patch should be worn until postoperative exam tomorrow.  ____ Solar shield eyeglasses should be worn for comfort in the sunlight/patch while sleeping  Resume all regular medications including aspirin or Coumadin if these were discontinued prior to surgery. You may shower, bathe, shave, or wash your hair. Tylenol may be taken for mild discomfort.  Call your doctor if you experience significant pain, nausea, or vomiting, fever > 101 or other signs of infection. [phone] or [phone] Specific instructions:  Follow-up Information    PORFILIO,WILLIAM LOUIS, MD Follow up.   Specialty:  Ophthalmology Why:  Tomorrow at 9:10 am. Contact information: [address] [place] [address] [phone]

## 2016-09-18 NOTE — Transfer of Care (Signed)
Immediate Anesthesia Transfer of Care Note  Patient: Christina Williams  Procedure(s) Performed: Procedure(s) with comments: CATARACT EXTRACTION PHACO AND INTRAOCULAR LENS PLACEMENT (IOC) (Right) - [place] 42.4 AP% 18.6 CDE 7.89 Fluid pack lot # [id number]  Patient Location: PACU  Anesthesia Type:MAC  Level of Consciousness: awake, alert  and oriented  Airway & Oxygen Therapy: Patient Spontanous Breathing  Post-op Assessment: Report given to RN and Post -op Vital signs reviewed and stable  Post vital signs: Reviewed and stable  Last Vitals:  Vitals:   09/18/16 1011 09/18/16 1147  BP: 136/71 (!) 142/75  Pulse:  75  Resp:    Temp:  36.3 C    Last Pain:  Vitals:   09/18/16 1010  TempSrc: Tympanic  PainSc: 8          Complications: No apparent anesthesia complications

## 2016-09-18 NOTE — Op Note (Signed)
PREOPERATIVE DIAGNOSIS:  Nuclear sclerotic cataract of the right eye.   POSTOPERATIVE DIAGNOSIS:  NUCLEAR SCLEROTIC CATARACT RIGHT EYE   OPERATIVE PROCEDURE: Procedure(s): CATARACT EXTRACTION PHACO AND INTRAOCULAR LENS PLACEMENT (IOC)   SURGEON:  Birder Robson, MD.   ANESTHESIA:  Anesthesiologist: Martha Clan, MD CRNA: Hedda Slade, CRNA  1.      Managed anesthesia care. 2.      0.33ml of Shugarcaine was instilled in the eye following the paracentesis.   COMPLICATIONS:  None.   TECHNIQUE:   Stop and chop   DESCRIPTION OF PROCEDURE:  The patient was examined and consented in the preoperative holding area where the aforementioned topical anesthesia was applied to the right eye and then brought back to the Operating Room where the right eye was prepped and draped in the usual sterile ophthalmic fashion and a lid speculum was placed. A paracentesis was created with the side port blade and the anterior chamber was filled with viscoelastic. A near clear corneal incision was performed with the steel keratome. A continuous curvilinear capsulorrhexis was performed with a cystotome followed by the capsulorrhexis forceps. Hydrodissection and hydrodelineation were carried out with BSS on a blunt cannula. The lens was removed in a stop and chop  technique and the remaining cortical material was removed with the irrigation-aspiration handpiece. The capsular bag was inflated with viscoelastic and the Technis [id number]  lens was placed in the capsular bag without complication. The remaining viscoelastic was removed from the eye with the irrigation-aspiration handpiece. The wounds were hydrated. The anterior chamber was flushed with Miostat and the eye was inflated to physiologic pressure. 0.61ml of Vigamox was placed in the anterior chamber. The wounds were found to be water tight. The eye was dressed with Vigamox. The patient was given protective glasses to wear throughout the day and a shield with which to  sleep tonight. The patient was also given drops with which to begin a drop regimen today and will follow-up with me in one day.  Implant Name Type Inv. Item Serial No. Manufacturer Lot No. LRB No. Used  LENS IOL DIOP 11.0 - [id number] Intraocular Lens LENS IOL DIOP 11.0 [id number]   Right 1   Procedure(s) with comments: CATARACT EXTRACTION PHACO AND INTRAOCULAR LENS PLACEMENT (IOC) (Right) - [place] 42.4 AP% 18.6 CDE 7.89 Fluid pack lot # [id number]  Electronically signed: [place] 09/18/2016 11:43 AM

## 2016-09-18 NOTE — H&P (Signed)
All labs reviewed. Abnormal studies sent to patients PCP when indicated.  Previous H&P reviewed, patient examined, there are NO CHANGES.  Christina Knoblock LOUIS11/28/201711:15 AM

## 2016-09-18 NOTE — Anesthesia Procedure Notes (Addendum)
Performed by: Hedda Slade Oxygen Delivery Method: Nasal cannula

## 2016-09-18 NOTE — Anesthesia Postprocedure Evaluation (Signed)
Anesthesia Post Note  Patient: Christina Williams  Procedure(s) Performed: Procedure(s) (LRB): CATARACT EXTRACTION PHACO AND INTRAOCULAR LENS PLACEMENT (IOC) (Right)  Patient location during evaluation: PACU Anesthesia Type: MAC Level of consciousness: awake, awake and alert and oriented Pain management: pain level controlled Vital Signs Assessment: post-procedure vital signs reviewed and stable Respiratory status: spontaneous breathing Cardiovascular status: blood pressure returned to baseline Postop Assessment: no signs of nausea or vomiting Anesthetic complications: no    Last Vitals:  Vitals:   09/18/16 1011 09/18/16 1147  BP: 136/71 (!) 142/75  Pulse:  75  Resp:    Temp:  36.3 C    Last Pain:  Vitals:   09/18/16 1010  TempSrc: Tympanic  PainSc: [place]

## 2016-10-02 ENCOUNTER — Encounter: Payer: Self-pay | Admitting: *Deleted

## 2016-10-02 ENCOUNTER — Ambulatory Visit
Admission: RE | Admit: 2016-10-02 | Discharge: 2016-10-02 | Disposition: A | Discharge status: Home / Self Care | Payer: Commercial Managed Care - HMO | Source: Ambulatory Visit | Attending: Ophthalmology | Admitting: Ophthalmology

## 2016-10-02 ENCOUNTER — Ambulatory Visit: Payer: Commercial Managed Care - HMO | Admitting: Anesthesiology

## 2016-10-02 ENCOUNTER — Encounter
Admission: RE | Disposition: A | Discharge status: Home / Self Care | Payer: Self-pay | Source: Ambulatory Visit | Attending: Ophthalmology

## 2016-10-02 DIAGNOSIS — H2512 Age-related nuclear cataract, left eye: Secondary | ICD-10-CM | POA: Insufficient documentation

## 2016-10-02 DIAGNOSIS — K219 Gastro-esophageal reflux disease without esophagitis: Secondary | ICD-10-CM | POA: Diagnosis not present

## 2016-10-02 HISTORY — PX: CATARACT EXTRACTION W/PHACO: SHX586

## 2016-10-02 SURGERY — PHACOEMULSIFICATION, CATARACT, WITH IOL INSERTION
Anesthesia: Monitor Anesthesia Care | Site: Eye | Laterality: Left | Wound class: Clean

## 2016-10-02 MED ORDER — NA CHONDROIT SULF-NA HYALURON 40-17 MG/ML IO SOLN
INTRAOCULAR | Status: AC
Start: 2016-10-02 — End: 2016-10-02
  Filled 2016-10-02: qty 1

## 2016-10-02 MED ORDER — LIDOCAINE HCL (PF) 4 % IJ SOLN
INTRAMUSCULAR | Status: AC
  Filled 2016-10-02: qty 5

## 2016-10-02 MED ORDER — MOXIFLOXACIN HCL 0.5 % OP SOLN
OPHTHALMIC | Status: AC
  Filled 2016-10-02: qty 3

## 2016-10-02 MED ORDER — MOXIFLOXACIN HCL 0.5 % OP SOLN
1.0000 [drp] | OPHTHALMIC | Status: AC
  Administered 2016-10-02 (×3): 1 [drp] via OPHTHALMIC

## 2016-10-02 MED ORDER — MOXIFLOXACIN HCL 0.5 % OP SOLN
OPHTHALMIC | Status: DC | PRN
  Administered 2016-10-02: 1 [drp] via OPHTHALMIC

## 2016-10-02 MED ORDER — FENTANYL CITRATE (PF) 100 MCG/2ML IJ SOLN
INTRAMUSCULAR | Status: DC | PRN
  Administered 2016-10-02: 50 ug via INTRAVENOUS

## 2016-10-02 MED ORDER — NA CHONDROIT SULF-NA HYALURON 40-17 MG/ML IO SOLN
INTRAOCULAR | Status: DC | PRN
  Administered 2016-10-02: 1 mL via INTRAOCULAR

## 2016-10-02 MED ORDER — LIDOCAINE HCL (PF) 4 % IJ SOLN
INTRAOCULAR | Status: DC | PRN
  Administered 2016-10-02: 4 mL via OPHTHALMIC

## 2016-10-02 MED ORDER — MIDAZOLAM HCL 2 MG/2ML IJ SOLN
INTRAMUSCULAR | Status: DC | PRN
  Administered 2016-10-02: 1 mg via INTRAVENOUS

## 2016-10-02 MED ORDER — POVIDONE-IODINE 5 % OP SOLN
OPHTHALMIC | Status: AC
  Filled 2016-10-02: qty 30

## 2016-10-02 MED ORDER — SODIUM CHLORIDE 0.9 % IV SOLN
INTRAVENOUS | Status: DC
  Administered 2016-10-02: 08:00:00 via INTRAVENOUS

## 2016-10-02 MED ORDER — ARMC OPHTHALMIC DILATING DROPS
OPHTHALMIC | Status: AC
  Filled 2016-10-02: qty 0.4

## 2016-10-02 MED ORDER — EPINEPHRINE PF 1 MG/ML IJ SOLN
INTRAMUSCULAR | Status: AC
  Filled 2016-10-02: qty 2

## 2016-10-02 MED ORDER — CARBACHOL 0.01 % IO SOLN
INTRAOCULAR | Status: DC | PRN
  Administered 2016-10-02: 0.5 mL via INTRAOCULAR

## 2016-10-02 MED ORDER — ARMC OPHTHALMIC DILATING DROPS
1.0000 "application " | OPHTHALMIC | Status: AC
  Administered 2016-10-02 (×3): 1 via OPHTHALMIC

## 2016-10-02 MED ORDER — EPINEPHRINE PF 1 MG/ML IJ SOLN
INTRAMUSCULAR | Status: DC | PRN
  Administered 2016-10-02: 09:00:00 via OPHTHALMIC

## 2016-10-02 SURGICAL SUPPLY — 21 items
CANNULA ANT/CHMB 27GA (MISCELLANEOUS) ×2 IMPLANT
CUP MEDICINE 2OZ PLAST GRAD ST (MISCELLANEOUS) ×2 IMPLANT
GLOVE BIO SURGEON STRL SZ8 (GLOVE) ×2 IMPLANT
GLOVE BIOGEL M 6.5 STRL (GLOVE) ×2 IMPLANT
GLOVE SURG LX 8.0 MICRO (GLOVE) ×1
GLOVE SURG LX STRL 8.0 MICRO (GLOVE) ×1 IMPLANT
GOWN STRL REUS W/ TWL LRG LVL3 (GOWN DISPOSABLE) ×2 IMPLANT
GOWN STRL REUS W/TWL LRG LVL3 (GOWN DISPOSABLE) ×2
LENS IOL TECNIS ITEC 12.5 (Intraocular Lens) ×2 IMPLANT
PACK CATARACT (MISCELLANEOUS) ×2 IMPLANT
PACK CATARACT BRASINGTON LX (MISCELLANEOUS) ×2 IMPLANT
PACK EYE AFTER SURG (MISCELLANEOUS) ×2 IMPLANT
SOL BSS BAG (MISCELLANEOUS) ×2
SOL PREP PVP 2OZ (MISCELLANEOUS) ×2
SOLUTION BSS BAG (MISCELLANEOUS) ×1 IMPLANT
SOLUTION PREP PVP 2OZ (MISCELLANEOUS) ×1 IMPLANT
SYR 3ML LL SCALE MARK (SYRINGE) ×2 IMPLANT
SYR 5ML LL (SYRINGE) ×2 IMPLANT
SYR TB 1ML 27GX1/2 LL (SYRINGE) ×2 IMPLANT
WATER STERILE IRR 250ML POUR (IV SOLUTION) ×2 IMPLANT
WIPE NON LINTING 3.25X3.25 (MISCELLANEOUS) ×2 IMPLANT

## 2016-10-02 NOTE — Transfer of Care (Signed)
Immediate Anesthesia Transfer of Care Note  Patient: DAVIANNA MULCH  Procedure(s) Performed: Procedure(s) with comments: CATARACT EXTRACTION PHACO AND INTRAOCULAR LENS PLACEMENT (IOC) (Left) - [place] 51.9 AP% 20.9 CDE 10.86 Fluid pack lot # [id number]  Patient Location: PACU  Anesthesia Type:MAC  Level of Consciousness: awake and alert   Airway & Oxygen Therapy: Patient Spontanous Breathing  Post-op Assessment: Report given to RN and Post -op Vital signs reviewed and stable  Post vital signs: Reviewed and stable  Last Vitals:  Vitals:   10/02/16 0739 10/02/16 0918  BP: (!) 146/71 134/64  Pulse: 75 71  Resp: 16 15  Temp: 36.5 C 36.2 C    Last Pain:  Vitals:   10/02/16 0918  TempSrc: Temporal  PainSc: 0-No pain         Complications: No apparent anesthesia complications

## 2016-10-02 NOTE — Anesthesia Procedure Notes (Signed)
Performed by: Hedda Slade Pre-anesthesia Checklist: Patient identified, Suction available, Emergency Drugs available and Patient being monitored Oxygen Delivery Method: Nasal cannula

## 2016-10-02 NOTE — Discharge Instructions (Signed)
Eye Surgery Discharge Instructions  Expect mild scratchy sensation or mild soreness. DO NOT RUB YOUR EYE!  The day of surgery:  Minimal physical activity, but bed rest is not required  No reading, computer work, or close hand work  No bending, lifting, or straining.  May watch TV  For 24 hours:  No driving, legal decisions, or alcoholic beverages  Safety precautions  Eat anything you prefer: It is better to start with liquids, then soup then solid foods.  _____ Eye patch should be worn until postoperative exam tomorrow.  ____ Solar shield eyeglasses should be worn for comfort in the sunlight/patch while sleeping  Resume all regular medications including aspirin or Coumadin if these were discontinued prior to surgery. You may shower, bathe, shave, or wash your hair. Tylenol may be taken for mild discomfort.  Call your doctor if you experience significant pain, nausea, or vomiting, fever > 101 or other signs of infection. [phone] or [phone] Specific instructions:  Follow-up Information    PORFILIO,WILLIAM LOUIS, MD Follow up.   Specialty:  Ophthalmology Why:  10/02/16 at 9:10 Contact information: [address] [place] [address] [phone]          Eye Surgery Discharge Instructions  Expect mild scratchy sensation or mild soreness. DO NOT RUB YOUR EYE!  The day of surgery:  Minimal physical activity, but bed rest is not required  No reading, computer work, or close hand work  No bending, lifting, or straining.  May watch TV  For 24 hours:  No driving, legal decisions, or alcoholic beverages  Safety precautions  Eat anything you prefer: It is better to start with liquids, then soup then solid foods.  _____ Eye patch should be worn until postoperative exam tomorrow.  ____ Solar shield eyeglasses should be worn for comfort in the sunlight/patch while sleeping  Resume all regular medications including aspirin or Coumadin if these  were discontinued prior to surgery. You may shower, bathe, shave, or wash your hair. Tylenol may be taken for mild discomfort.  Call your doctor if you experience significant pain, nausea, or vomiting, fever > 101 or other signs of infection. [phone] or [phone] Specific instructions:  Follow-up Information    PORFILIO,WILLIAM LOUIS, MD Follow up.   Specialty:  Ophthalmology Why:  10/02/16 at 9:10 Contact information: [place] [address] [phone]

## 2016-10-02 NOTE — Anesthesia Postprocedure Evaluation (Signed)
Anesthesia Post Note  Patient: Christina Williams  Procedure(s) Performed: Procedure(s) (LRB): CATARACT EXTRACTION PHACO AND INTRAOCULAR LENS PLACEMENT (IOC) (Left)  Patient location during evaluation: PACU Anesthesia Type: MAC Level of consciousness: awake, awake and alert and oriented Pain management: pain level controlled Vital Signs Assessment: post-procedure vital signs reviewed and stable Respiratory status: spontaneous breathing and nonlabored ventilation Cardiovascular status: blood pressure returned to baseline and stable Postop Assessment: no signs of nausea or vomiting Anesthetic complications: no    Last Vitals:  Vitals:   10/02/16 0739 10/02/16 0918  BP: (!) 146/71 134/64  Pulse: 75 71  Resp: 16 15  Temp: 36.5 C 36.2 C    Last Pain:  Vitals:   10/02/16 0918  TempSrc: Temporal  PainSc: 0-No pain                 Azarria Balint Lorenza Chick

## 2016-10-02 NOTE — Anesthesia Preprocedure Evaluation (Signed)
Anesthesia Evaluation  Patient identified by MRN, date of birth, ID band Patient awake    Reviewed: Allergy & Precautions, NPO status , Patient's Chart, lab work & pertinent test results  History of Anesthesia Complications Negative for: history of anesthetic complications  Airway Mallampati: II  TM Distance: >3 FB Neck ROM: Full    Dental no notable dental hx.    Pulmonary neg pulmonary ROS, neg sleep apnea, neg COPD,    breath sounds clear to auscultation- rhonchi (-) wheezing      Cardiovascular Exercise Tolerance: Good (-) hypertension(-) CAD and (-) Past MI  Rhythm:Regular Rate:Normal - Systolic murmurs and - Diastolic murmurs    Neuro/Psych negative neurological ROS  negative psych ROS   GI/Hepatic negative GI ROS, Neg liver ROS, GERD  ,  Endo/Other  negative endocrine ROSneg diabetes  Renal/GU negative Renal ROS     Musculoskeletal negative musculoskeletal ROS (+)   Abdominal (+) - obese,   Peds  Hematology negative hematology ROS (+)   Anesthesia Other Findings   Reproductive/Obstetrics                             Anesthesia Physical Anesthesia Plan  ASA: II  Anesthesia Plan: MAC   Post-op Pain Management:    Induction: Intravenous  Airway Management Planned: Natural Airway  Additional Equipment:   Intra-op Plan:   Post-operative Plan:   Informed Consent: I have reviewed the patients History and Physical, chart, labs and discussed the procedure including the risks, benefits and alternatives for the proposed anesthesia with the patient or authorized representative who has indicated his/her understanding and acceptance.     Plan Discussed with: CRNA and Anesthesiologist  Anesthesia Plan Comments:         Anesthesia Quick Evaluation

## 2016-10-02 NOTE — Op Note (Signed)
PREOPERATIVE DIAGNOSIS:  Nuclear sclerotic cataract of the left eye.   POSTOPERATIVE DIAGNOSIS:  Nuclear sclerotic cataract of the left eye.   OPERATIVE PROCEDURE: Procedure(s): CATARACT EXTRACTION PHACO AND INTRAOCULAR LENS PLACEMENT (IOC)   SURGEON:  Birder Robson, MD.   ANESTHESIA:  Anesthesiologist: Emmie Niemann, MD CRNA: Hedda Slade, CRNA  1.      Managed anesthesia care. 2.     0.57ml of Shugarcaine was instilled following the paracentesis   COMPLICATIONS:  None.   TECHNIQUE:   Stop and chop   DESCRIPTION OF PROCEDURE:  The patient was examined and consented in the preoperative holding area where the aforementioned topical anesthesia was applied to the left eye and then brought back to the Operating Room where the left eye was prepped and draped in the usual sterile ophthalmic fashion and a lid speculum was placed. A paracentesis was created with the side port blade and the anterior chamber was filled with viscoelastic. A near clear corneal incision was performed with the steel keratome. A continuous curvilinear capsulorrhexis was performed with a cystotome followed by the capsulorrhexis forceps. Hydrodissection and hydrodelineation were carried out with BSS on a blunt cannula. The lens was removed in a stop and chop  technique and the remaining cortical material was removed with the irrigation-aspiration handpiece. The capsular bag was inflated with viscoelastic and the Technis [id number] lens was placed in the capsular bag without complication. The remaining viscoelastic was removed from the eye with the irrigation-aspiration handpiece. The wounds were hydrated. The anterior chamber was flushed with Miostat and the eye was inflated to physiologic pressure. 0.22ml Vigamox was placed in the anterior chamber. The wounds were found to be water tight. The eye was dressed with Vigamox. The patient was given protective glasses to wear throughout the day and a shield with which to sleep tonight.  The patient was also given drops with which to begin a drop regimen today and will follow-up with me in one day.  Implant Name Type Inv. Item Serial No. Manufacturer Lot No. LRB No. Used  LENS IOL TECNIS 12.5 - [id number] Intraocular Lens LENS IOL TECNIS 12.5 [id number] AMO   Left 1    Procedure(s) with comments: CATARACT EXTRACTION PHACO AND INTRAOCULAR LENS PLACEMENT (IOC) (Left) - [place] 51.9 AP% 20.9 CDE 10.86 Fluid pack lot # [id number]  Electronically signed: [place] 10/02/2016 9:16 AM

## 2016-10-02 NOTE — H&P (Signed)
All labs reviewed. Abnormal studies sent to patients PCP when indicated.  Previous H&P reviewed, patient examined, there are NO CHANGES.  Christina Williams LOUIS12/12/20178:51 AM

## 2016-10-03 ENCOUNTER — Encounter: Payer: Self-pay | Admitting: Ophthalmology

## 2017-01-04 ENCOUNTER — Ambulatory Visit
Admission: RE | Admit: 2017-01-04 | Discharge: 2017-01-04 | Disposition: A | Discharge status: Home / Self Care | Payer: Commercial Managed Care - HMO | Source: Ambulatory Visit | Attending: Physician Assistant | Admitting: Physician Assistant

## 2017-01-04 DIAGNOSIS — Z1239 Encounter for other screening for malignant neoplasm of breast: Secondary | ICD-10-CM

## 2017-01-04 DIAGNOSIS — Z1231 Encounter for screening mammogram for malignant neoplasm of breast: Secondary | ICD-10-CM | POA: Insufficient documentation

## 2017-01-25 ENCOUNTER — Encounter: Payer: Self-pay | Admitting: Physician Assistant

## 2017-01-25 DIAGNOSIS — E7800 Pure hypercholesterolemia, unspecified: Secondary | ICD-10-CM

## 2017-01-25 MED ORDER — SIMVASTATIN 20 MG PO TABS: 20.0000 mg | ORAL_TABLET | Freq: Every day | ORAL | 3 refills | Status: DC

## 2017-11-21 ENCOUNTER — Other Ambulatory Visit: Payer: Self-pay | Admitting: Physician Assistant

## 2017-11-21 DIAGNOSIS — Z1231 Encounter for screening mammogram for malignant neoplasm of breast: Secondary | ICD-10-CM

## 2017-12-13 ENCOUNTER — Other Ambulatory Visit: Payer: Self-pay | Admitting: Physician Assistant

## 2017-12-13 DIAGNOSIS — E7800 Pure hypercholesterolemia, unspecified: Secondary | ICD-10-CM

## 2018-01-10 ENCOUNTER — Telehealth: Payer: Self-pay

## 2018-01-10 ENCOUNTER — Ambulatory Visit
Admission: RE | Admit: 2018-01-10 | Discharge: 2018-01-10 | Disposition: A | Discharge status: Home / Self Care | Payer: 59 | Source: Ambulatory Visit | Attending: Physician Assistant | Admitting: Physician Assistant

## 2018-01-10 DIAGNOSIS — Z1231 Encounter for screening mammogram for malignant neoplasm of breast: Secondary | ICD-10-CM | POA: Diagnosis not present

## 2018-01-10 NOTE — Telephone Encounter (Signed)
-----   Message from Mar Daring, [place] sent at 01/10/2018  2:01 PM EDT ----- Normal mammogram. Repeat screening in one year.

## 2018-01-10 NOTE — Telephone Encounter (Signed)
Patient advised as directed below.  Thanks,  -Abir Craine 

## 2018-03-03 ENCOUNTER — Other Ambulatory Visit: Payer: Self-pay | Admitting: Physician Assistant

## 2018-03-03 DIAGNOSIS — E7800 Pure hypercholesterolemia, unspecified: Secondary | ICD-10-CM

## 2018-03-03 NOTE — Telephone Encounter (Signed)
[facility] pharmacy faxed a refill request for the following medication. Thanks CC ° °simvastatin (ZOCOR) 20 MG tablet  ° °

## 2018-03-04 MED ORDER — SIMVASTATIN 20 MG PO TABS: 20.0000 mg | ORAL_TABLET | Freq: Every day | ORAL | 0 refills | Status: DC

## 2018-03-04 NOTE — Telephone Encounter (Signed)
Patient needs appt before more refills 

## 2018-03-18 ENCOUNTER — Other Ambulatory Visit: Payer: Self-pay | Admitting: Physician Assistant

## 2018-03-18 DIAGNOSIS — E7800 Pure hypercholesterolemia, unspecified: Secondary | ICD-10-CM

## 2018-03-21 ENCOUNTER — Encounter: Payer: Self-pay | Admitting: Physician Assistant

## 2018-03-21 ENCOUNTER — Ambulatory Visit (INDEPENDENT_AMBULATORY_CARE_PROVIDER_SITE_OTHER): Payer: 59 | Admitting: Physician Assistant

## 2018-03-21 VITALS — BP 118/74 | HR 64 | Temp 98.0°F | Resp 16 | Ht 67.0 in | Wt 113.0 lb

## 2018-03-21 DIAGNOSIS — E7800 Pure hypercholesterolemia, unspecified: Secondary | ICD-10-CM | POA: Diagnosis not present

## 2018-03-21 DIAGNOSIS — Z1159 Encounter for screening for other viral diseases: Secondary | ICD-10-CM | POA: Diagnosis not present

## 2018-03-21 DIAGNOSIS — Z0000 Encounter for general adult medical examination without abnormal findings: Secondary | ICD-10-CM | POA: Diagnosis not present

## 2018-03-21 MED ORDER — SIMVASTATIN 20 MG PO TABS: ORAL_TABLET | ORAL | 3 refills | Status: DC

## 2018-03-21 NOTE — Patient Instructions (Signed)
Health Maintenance for Postmenopausal Women Menopause is a normal process in which your reproductive ability comes to an end. This process happens gradually over a span of months to years, usually between the ages of 22 and 9. Menopause is complete when you have missed 12 consecutive menstrual periods. It is important to talk with your health care provider about some of the most common conditions that affect postmenopausal women, such as heart disease, cancer, and bone loss (osteoporosis). Adopting a healthy lifestyle and getting preventive care can help to promote your health and wellness. Those actions can also lower your chances of developing some of these common conditions. What should I know about menopause? During menopause, you may experience a number of symptoms, such as:  Moderate-to-severe hot flashes.  Night sweats.  Decrease in sex drive.  Mood swings.  Headaches.  Tiredness.  Irritability.  Memory problems.  Insomnia.  Choosing to treat or not to treat menopausal changes is an individual decision that you make with your health care provider. What should I know about hormone replacement therapy and supplements? Hormone therapy products are effective for treating symptoms that are associated with menopause, such as hot flashes and night sweats. Hormone replacement carries certain risks, especially as you become older. If you are thinking about using estrogen or estrogen with progestin treatments, discuss the benefits and risks with your health care provider. What should I know about heart disease and stroke? Heart disease, heart attack, and stroke become more likely as you age. This may be due, in part, to the hormonal changes that your body experiences during menopause. These can affect how your body processes dietary fats, triglycerides, and cholesterol. Heart attack and stroke are both medical emergencies. There are many things that you can do to help prevent heart disease  and stroke:  Have your blood pressure checked at least every 1-2 years. High blood pressure causes heart disease and increases the risk of stroke.  If you are 53-22 years old, ask your health care provider if you should take aspirin to prevent a heart attack or a stroke.  Do not use any tobacco products, including cigarettes, chewing tobacco, or electronic cigarettes. If you need help quitting, ask your health care provider.  It is important to eat a healthy diet and maintain a healthy weight. ? Be sure to include plenty of vegetables, fruits, low-fat dairy products, and lean protein. ? Avoid eating foods that are high in solid fats, added sugars, or salt (sodium).  Get regular exercise. This is one of the most important things that you can do for your health. ? Try to exercise for at least 150 minutes each week. The type of exercise that you do should increase your heart rate and make you sweat. This is known as moderate-intensity exercise. ? Try to do strengthening exercises at least twice each week. Do these in addition to the moderate-intensity exercise.  Know your numbers.Ask your health care provider to check your cholesterol and your blood glucose. Continue to have your blood tested as directed by your health care provider.  What should I know about cancer screening? There are several types of cancer. Take the following steps to reduce your risk and to catch any cancer development as early as possible. Breast Cancer  Practice breast self-awareness. ? This means understanding how your breasts normally appear and feel. ? It also means doing regular breast self-exams. Let your health care provider know about any changes, no matter how small.  If you are 40  or older, have a clinician do a breast exam (clinical breast exam or CBE) every year. Depending on your age, family history, and medical history, it may be recommended that you also have a yearly breast X-ray (mammogram).  If you  have a family history of breast cancer, talk with your health care provider about genetic screening.  If you are at high risk for breast cancer, talk with your health care provider about having an MRI and a mammogram every year.  Breast cancer (BRCA) gene test is recommended for women who have family members with BRCA-related cancers. Results of the assessment will determine the need for genetic counseling and BRCA1 and for BRCA2 testing. BRCA-related cancers include these types: ? Breast. This occurs in males or females. ? Ovarian. ? Tubal. This may also be called fallopian tube cancer. ? Cancer of the abdominal or pelvic lining (peritoneal cancer). ? Prostate. ? Pancreatic.  Cervical, Uterine, and Ovarian Cancer Your health care provider may recommend that you be screened regularly for cancer of the pelvic organs. These include your ovaries, uterus, and vagina. This screening involves a pelvic exam, which includes checking for microscopic changes to the surface of your cervix (Pap test).  For women ages 21-65, health care providers may recommend a pelvic exam and a Pap test every three years. For women ages 79-65, they may recommend the Pap test and pelvic exam, combined with testing for human papilloma virus (HPV), every five years. Some types of HPV increase your risk of cervical cancer. Testing for HPV may also be done on women of any age who have unclear Pap test results.  Other health care providers may not recommend any screening for nonpregnant women who are considered low risk for pelvic cancer and have no symptoms. Ask your health care provider if a screening pelvic exam is right for you.  If you have had past treatment for cervical cancer or a condition that could lead to cancer, you need Pap tests and screening for cancer for at least 20 years after your treatment. If Pap tests have been discontinued for you, your risk factors (such as having a new sexual partner) need to be  reassessed to determine if you should start having screenings again. Some women have medical problems that increase the chance of getting cervical cancer. In these cases, your health care provider may recommend that you have screening and Pap tests more often.  If you have a family history of uterine cancer or ovarian cancer, talk with your health care provider about genetic screening.  If you have vaginal bleeding after reaching menopause, tell your health care provider.  There are currently no reliable tests available to screen for ovarian cancer.  Lung Cancer Lung cancer screening is recommended for adults 69-62 years old who are at high risk for lung cancer because of a history of smoking. A yearly low-dose CT scan of the lungs is recommended if you:  Currently smoke.  Have a history of at least 30 pack-years of smoking and you currently smoke or have quit within the past 15 years. A pack-year is smoking an average of one pack of cigarettes per day for one year.  Yearly screening should:  Continue until it has been 15 years since you quit.  Stop if you develop a health problem that would prevent you from having lung cancer treatment.  Colorectal Cancer  This type of cancer can be detected and can often be prevented.  Routine colorectal cancer screening usually begins at  age 42 and continues through age 45.  If you have risk factors for colon cancer, your health care provider may recommend that you be screened at an earlier age.  If you have a family history of colorectal cancer, talk with your health care provider about genetic screening.  Your health care provider may also recommend using home test kits to check for hidden blood in your stool.  A small camera at the end of a tube can be used to examine your colon directly (sigmoidoscopy or colonoscopy). This is done to check for the earliest forms of colorectal cancer.  Direct examination of the colon should be repeated every  5-10 years until age 71. However, if early forms of precancerous polyps or small growths are found or if you have a family history or genetic risk for colorectal cancer, you may need to be screened more often.  Skin Cancer  Check your skin from head to toe regularly.  Monitor any moles. Be sure to tell your health care provider: ? About any new moles or changes in moles, especially if there is a change in a mole's shape or color. ? If you have a mole that is larger than the size of a pencil eraser.  If any of your family members has a history of skin cancer, especially at a young age, talk with your health care provider about genetic screening.  Always use sunscreen. Apply sunscreen liberally and repeatedly throughout the day.  Whenever you are outside, protect yourself by wearing long sleeves, pants, a wide-brimmed hat, and sunglasses.  What should I know about osteoporosis? Osteoporosis is a condition in which bone destruction happens more quickly than new bone creation. After menopause, you may be at an increased risk for osteoporosis. To help prevent osteoporosis or the bone fractures that can happen because of osteoporosis, the following is recommended:  If you are 46-71 years old, get at least 1,000 mg of calcium and at least 600 mg of vitamin D per day.  If you are older than age 55 but younger than age 65, get at least 1,200 mg of calcium and at least 600 mg of vitamin D per day.  If you are older than age 54, get at least 1,200 mg of calcium and at least 800 mg of vitamin D per day.  Smoking and excessive alcohol intake increase the risk of osteoporosis. Eat foods that are rich in calcium and vitamin D, and do weight-bearing exercises several times each week as directed by your health care provider. What should I know about how menopause affects my mental health? Depression may occur at any age, but it is more common as you become older. Common symptoms of depression  include:  Low or sad mood.  Changes in sleep patterns.  Changes in appetite or eating patterns.  Feeling an overall lack of motivation or enjoyment of activities that you previously enjoyed.  Frequent crying spells.  Talk with your health care provider if you think that you are experiencing depression. What should I know about immunizations? It is important that you get and maintain your immunizations. These include:  Tetanus, diphtheria, and pertussis (Tdap) booster vaccine.  Influenza every year before the flu season begins.  Pneumonia vaccine.  Shingles vaccine.  Your health care provider may also recommend other immunizations. This information is not intended to replace advice given to you by your health care provider. Make sure you discuss any questions you have with your health care provider. Document Released: 11/30/2005  Document Revised: 04/27/2016 Document Reviewed: 07/12/2015 [employer] Interactive Patient Education  2018 [employer]  

## 2018-03-21 NOTE — Progress Notes (Signed)
Patient: Christina Williams, Female    DOB: Mar 07, 1956, 62 y.o.   MRN: [id number] Visit Date: 03/21/2018  Today's Provider: Mar Daring, PA-C   Chief Complaint  Patient presents with  . Annual Exam   Subjective:    Annual physical exam Christina Williams is a 62 y.o. female who presents today for health maintenance and complete physical. She feels well. She reports exercising not regularly. She reports she is sleeping well.  Pap- 08/03/2015. Normal.  Mammogram- 01/10/2018. Normal. Colonoscopy- 09/05/2016. Normal. Tdap- 02/01/2010.    Review of Systems  Constitutional: Negative.   HENT: Negative.   Eyes: Negative.   Respiratory: Negative.   Cardiovascular: Negative.   Gastrointestinal: Negative.   Endocrine: Negative.   Genitourinary: Negative.   Musculoskeletal: Negative.   Skin: Negative.   Allergic/Immunologic: Negative.   Neurological: Negative.   Hematological: Negative.   Psychiatric/Behavioral: Negative.     Social History      She  reports that she has never smoked. She has never used smokeless tobacco. She reports that she drinks about 0.6 oz of alcohol per week. She reports that she does not use drugs.       Social History   Socioeconomic History  . Marital status: Married    Spouse name: Not on file  . Number of children: 2  . Years of education: Not on file  . Highest education level: Not on file  Occupational History  . Not on file  Social Needs  . Financial resource strain: Not on file  . Food insecurity:    Worry: Not on file    Inability: Not on file  . Transportation needs:    Medical: Not on file    Non-medical: Not on file  Tobacco Use  . Smoking status: Never Smoker  . Smokeless tobacco: Never Used  Substance and Sexual Activity  . Alcohol use: Yes    Alcohol/week: 0.6 oz    Types: 1 Cans of beer per week    Comment: Ocassional-last time  . Drug use: No  . Sexual activity: Not on file  Lifestyle  . Physical activity:   Days per week: Not on file    Minutes per session: Not on file  . Stress: Not on file  Relationships  . Social connections:    Talks on phone: Not on file    Gets together: Not on file    Attends religious service: Not on file    Active member of club or organization: Not on file    Attends meetings of clubs or organizations: Not on file    Relationship status: Not on file  Other Topics Concern  . Not on file  Social History Narrative  . Not on file    Past Medical History:  Diagnosis Date  . Diverticulosis 2007  . GERD (gastroesophageal reflux disease)   . Hyperlipidemia   . IBS (irritable bowel syndrome)      Patient Active Problem List   Diagnosis Date Noted  . Abdominal pain 07/29/2015  . Allergic rhinitis 07/29/2015  . Melanocytic nevus of skin 07/29/2015  . History of measles, mumps, or rubella 07/29/2015  . Endometriosis 07/29/2015  . GERD (gastroesophageal reflux disease) 07/29/2015  . Hypercholesteremia 07/29/2015  . Irritable bowel syndrome 07/29/2015  . Osteopenia 07/29/2015  . Postmenopausal 07/29/2015  . Seasonal allergies 07/29/2015    Past Surgical History:  Procedure Laterality Date  . ABDOMINAL HYSTERECTOMY  2005  . APPENDECTOMY  1999  .  CATARACT EXTRACTION W/PHACO Right 09/18/2016   Procedure: CATARACT EXTRACTION PHACO AND INTRAOCULAR LENS PLACEMENT (IOC);  Surgeon: Birder Robson, MD;  Location: [facility];  Service: Ophthalmology;  Laterality: Right;  [place] 42.4 AP% 18.6 CDE 7.89 Fluid pack lot # [id number]  . CATARACT EXTRACTION W/PHACO Left 10/02/2016   Procedure: CATARACT EXTRACTION PHACO AND INTRAOCULAR LENS PLACEMENT (IOC);  Surgeon: Birder Robson, MD;  Location: [facility];  Service: Ophthalmology;  Laterality: Left;  [place] 51.9 AP% 20.9 CDE 10.86 Fluid pack lot # [id number]  . COLONOSCOPY  2007  . COLONOSCOPY WITH PROPOFOL N/A 09/05/2016   Procedure: COLONOSCOPY WITH PROPOFOL;  Surgeon: Christene Lye, MD;  Location: [facility];   Service: Endoscopy;  Laterality: N/A;  . LAPAROSCOPY  1986  . TONSILLECTOMY  1984  . UPPER GASTROINTESTINAL ENDOSCOPY  04/03/2004   Normal    Family History        Family Status  Relation Name Status  . Mother  Deceased at age 31       Diverticulosis of colon  . Father  Deceased at age 86       died fromheart infection; CABG  . Sister  Alive  . Brother  Alive       stent age 72  . Sister  Alive  . Mat Aunt  (Not Specified)        Her family history includes Breast cancer in her maternal aunt; CAD in her father and mother; Cancer in her maternal aunt and mother; Diabetes in her mother and sister; GER disease in her father; Hyperlipidemia in her sister; Hypertension in her mother and sister.      No Known Allergies   Current Outpatient Medications:  .  hyoscyamine (LEVBID) 0.375 MG 12 hr tablet, Take 1 tablet (0.375 mg total) by mouth 2 (two) times daily. (Patient taking differently: Take 0.375 mg by mouth every 12 (twelve) hours as needed for cramping. ), Disp: 60 tablet, Rfl: 5 .  Lactobacillus (ACIDOPHILUS PO), Take 1 tablet by mouth daily., Disp: , Rfl:  .  polycarbophil (FIBERCON) 625 MG tablet, Take 3 tablets by mouth daily., Disp: , Rfl:  .  simvastatin (ZOCOR) 20 MG tablet, TAKE 1 TABLET BY MOUTH AT  BEDTIME  (NEEDS APPOINTMENT BEFORE  MORE REFILLS), Disp: 30 tablet, Rfl: 0 .  co-enzyme Q-10 30 MG capsule, Take 30 mg by mouth 2 (two) times daily. , Disp: , Rfl:    Patient Care Team: Mar Daring, PA-C as PCP - General (Family Medicine) Christene Lye, MD (General Surgery) Margarita Rana, MD as Referring Physician (Family Medicine)      Objective:   Vitals: BP 118/74 (BP Location: Right Arm, Patient Position: Sitting, Cuff Size: Normal)   Pulse 64   Temp 98 F (36.7 C)   Resp 16   Ht 5\' 7"  (1.702 m)   Wt 113 lb (51.3 kg)   SpO2 99%   BMI 17.70 kg/m    Vitals:   03/21/18 1043  BP: 118/74  Pulse: 64  Resp: 16  Temp: 98 F (36.7 C)  SpO2:  99%  Weight: 113 lb (51.3 kg)  Height: 5\' 7"  (1.702 m)     Physical Exam  Constitutional: She is oriented to person, place, and time. She appears well-developed and well-nourished. No distress.  HENT:  Head: Normocephalic and atraumatic.  Right Ear: Hearing, tympanic membrane, external ear and ear canal normal.  Left Ear: Hearing, tympanic membrane, external ear and ear canal normal.  Nose: Nose  normal.  Mouth/Throat: Uvula is midline, oropharynx is clear and moist and mucous membranes are normal. No oropharyngeal exudate.  Eyes: Pupils are equal, round, and reactive to light. Conjunctivae and EOM are normal. Right eye exhibits no discharge. Left eye exhibits no discharge. No scleral icterus.  Neck: Normal range of motion. Neck supple. No JVD present. Carotid bruit is not present. No tracheal deviation present. No thyromegaly present.  Cardiovascular: Normal rate, regular rhythm, normal heart sounds and intact distal pulses. Exam reveals no gallop and no friction rub.  No murmur heard. Pulmonary/Chest: Effort normal and breath sounds normal. No respiratory distress. She has no wheezes. She has no rales. She exhibits no tenderness.  Abdominal: Soft. Bowel sounds are normal. She exhibits no distension and no mass. There is no tenderness. There is no rebound and no guarding.  Musculoskeletal: Normal range of motion. She exhibits no edema or tenderness.  Lymphadenopathy:    She has no cervical adenopathy.  Neurological: She is alert and oriented to person, place, and time.  Skin: Skin is warm and dry. No rash noted. She is not diaphoretic.  Psychiatric: She has a normal mood and affect. Her behavior is normal. Judgment and thought content normal.  Vitals reviewed.    Depression Screen PHQ 2/9 Scores 08/03/2016  PHQ - 2 Score 0      Assessment & Plan:     Routine Health Maintenance and Physical Exam  Exercise Activities and Dietary recommendations Goals    None       Immunization History  Administered Date(s) Administered  . Influenza,inj,Quad PF,6+ Mos 08/03/2015  . Tdap 02/01/2010  . Zoster Recombinat (Shingrix) 01/26/2017    Health Maintenance  Topic Date Due  . HIV Screening  12/27/1970  . INFLUENZA VACCINE  05/22/2018  . PAP SMEAR  08/03/2018  . MAMMOGRAM  01/11/2020  . TETANUS/TDAP  02/02/2020  . COLONOSCOPY  09/05/2026  . Hepatitis C Screening  Completed     Discussed health benefits of physical activity, and encouraged her to engage in regular exercise appropriate for her age and condition.    1. Annual physical exam Normal physical exam today. Will check labs as below and f/u pending lab results. If labs are stable and WNL she will not need to have these rechecked for one year at her next annual physical exam. She is to call the office in the meantime if she has any acute issue, questions or concerns. - Measles/Mumps/Rubella Immunity  2. Screening for measles Patient request testing for immunity to see if she needs to repeat vaccination. - Measles/Mumps/Rubella Immunity  3. Hypercholesteremia Stable. Diagnosis pulled for medication refill. Continue current medical treatment plan. Will check labs as below and f/u pending results. - simvastatin (ZOCOR) 20 MG tablet; TAKE 1 TABLET BY MOUTH AT  BEDTIME  Dispense: 90 tablet; Refill: [place], PA-C  [facility]

## 2018-03-29 LAB — COMPREHENSIVE METABOLIC PANEL
ALT: 16 IU/L (ref 0–32)
AST: 18 IU/L (ref 0–40)
Albumin/Globulin Ratio: 2.4 — ABNORMAL HIGH (ref 1.2–2.2)
Albumin: 4.3 g/dL (ref 3.6–4.8)
Alkaline Phosphatase: 66 IU/L (ref 39–117)
BUN/Creatinine Ratio: 18 (ref 12–28)
BUN: 15 mg/dL (ref 8–27)
Bilirubin Total: 0.5 mg/dL (ref 0.0–1.2)
CALCIUM: 9.5 mg/dL (ref 8.7–10.3)
CO2: 24 mmol/L (ref 20–29)
CREATININE: 0.84 mg/dL (ref 0.57–1.00)
Chloride: 106 mmol/L (ref 96–106)
GFR, EST AFRICAN AMERICAN: 86 mL/min/{1.73_m2} (ref >59)
GFR, EST NON AFRICAN AMERICAN: 75 mL/min/{1.73_m2} (ref >59)
GLOBULIN, TOTAL: 1.8 g/dL (ref 1.5–4.5)
Glucose: 94 mg/dL (ref 65–99)
Potassium: 4.7 mmol/L (ref 3.5–5.2)
Sodium: 143 mmol/L (ref 134–144)
TOTAL PROTEIN: 6.1 g/dL (ref 6.0–8.5)

## 2018-03-29 LAB — LIPID PANEL
CHOL/HDL RATIO: 3 ratio (ref 0.0–4.4)
Cholesterol, Total: 180 mg/dL (ref 100–199)
HDL: 61 mg/dL (ref >39)
LDL CALC: 104 mg/dL — AB (ref 0–99)
TRIGLYCERIDES: 74 mg/dL (ref 0–149)
VLDL CHOLESTEROL CAL: 15 mg/dL (ref 5–40)

## 2018-03-29 LAB — CBC WITH DIFFERENTIAL/PLATELET
Basophils Absolute: 0 10*3/uL (ref 0.0–0.2)
Basos: 1 %
EOS (ABSOLUTE): 0.1 10*3/uL (ref 0.0–0.4)
Eos: 3 %
HEMATOCRIT: 41.7 % (ref 34.0–46.6)
Hemoglobin: 13.7 g/dL (ref 11.1–15.9)
IMMATURE GRANS (ABS): 0 10*3/uL (ref 0.0–0.1)
IMMATURE GRANULOCYTES: 0 %
LYMPHS: 42 %
Lymphocytes Absolute: 2 10*3/uL (ref 0.7–3.1)
MCH: 31.1 pg (ref 26.6–33.0)
MCHC: 32.9 g/dL (ref 31.5–35.7)
MCV: 95 fL (ref 79–97)
MONOS ABS: 0.4 10*3/uL (ref 0.1–0.9)
Monocytes: 9 %
NEUTROS PCT: 45 %
Neutrophils Absolute: 2.2 10*3/uL (ref 1.4–7.0)
Platelets: 215 10*3/uL (ref 150–450)
RBC: 4.4 x10E6/uL (ref 3.77–5.28)
RDW: 13.7 % (ref 12.3–15.4)
WBC: 4.8 10*3/uL (ref 3.4–10.8)

## 2018-03-29 LAB — TSH: TSH: 2.77 u[IU]/mL (ref 0.450–4.500)

## 2018-03-29 LAB — MEASLES/MUMPS/RUBELLA IMMUNITY
MUMPS ABS, IGG: 70.2 AU/mL (ref >10.9)
RUBEOLA AB, IGG: <25 AU/mL — ABNORMAL LOW (ref >29.9)
Rubella Antibodies, IGG: 29.7 index (ref >0.99)

## 2018-03-31 ENCOUNTER — Telehealth: Payer: Self-pay

## 2018-03-31 NOTE — Telephone Encounter (Signed)
LMTCB

## 2018-03-31 NOTE — Telephone Encounter (Signed)
-----  Message from Mar Daring, [place] sent at 03/30/2018  3:52 PM EDT ----- Labs are normal. MMR titter does show you lack immunity to measles. I would recommend repeating the series of vaccines. If we have can schedule for [place] to complete. If we do not she can obtain from the health dept.

## 2018-04-01 NOTE — Telephone Encounter (Signed)
LMTCB

## 2018-04-01 NOTE — Telephone Encounter (Signed)
Patient advised as below. Patient verbalizes understanding and is in agreement with treatment plan. Patient will call health department for MMR vaccine.

## 2018-04-11 ENCOUNTER — Other Ambulatory Visit: Payer: Self-pay | Admitting: Physician Assistant

## 2018-04-11 DIAGNOSIS — E7800 Pure hypercholesterolemia, unspecified: Secondary | ICD-10-CM

## 2018-04-11 MED ORDER — SIMVASTATIN 20 MG PO TABS: ORAL_TABLET | ORAL | 3 refills | Status: DC

## 2018-04-11 NOTE — Telephone Encounter (Signed)
[facility] pharmacy faxed a refill request for the following medication. Thanks CC  simvastatin (ZOCOR) 20 MG tablet

## 2018-04-15 ENCOUNTER — Other Ambulatory Visit: Payer: Self-pay | Admitting: Physician Assistant

## 2018-04-15 DIAGNOSIS — E7800 Pure hypercholesterolemia, unspecified: Secondary | ICD-10-CM

## 2018-04-15 NOTE — Telephone Encounter (Signed)
Pt called checking on her Rx that was suppose to go to [facility] for a 90 supply of Simvastatin 20 mg.  The rx was sent to [facility].  She uses [facility] for her 90 refills...  Please send to [facility]

## 2018-04-16 MED ORDER — SIMVASTATIN 20 MG PO TABS: ORAL_TABLET | ORAL | 3 refills | Status: DC

## 2018-11-28 ENCOUNTER — Other Ambulatory Visit: Payer: Self-pay | Admitting: Physician Assistant

## 2018-11-28 DIAGNOSIS — Z1231 Encounter for screening mammogram for malignant neoplasm of breast: Secondary | ICD-10-CM

## 2019-04-02 NOTE — Progress Notes (Signed)
Patient: Christina Williams, Female    DOB: 06/05/1956, 63 y.o.   MRN: [id number] Visit Date: 04/03/2019  Today's Provider: Mar Daring, PA-C   Chief Complaint  Patient presents with  . Annual Exam   Subjective:     Annual physical exam Christina Williams is a 63 y.o. female who presents today for health maintenance and complete physical. She feels well. She reports exercising. She reports she is sleeping well.  ----------------------------------------------------------------- Mammogram:04/10/2019 Colonoscopy: 09/05/2016 [id number] 2005, Pap-Normal 08/08/2015  Review of Systems  Constitutional: Negative.   HENT: Negative.   Eyes: Negative.   Respiratory: Negative.   Cardiovascular: Negative.   Gastrointestinal: Negative.   Endocrine: Negative.   Genitourinary: Negative.   Musculoskeletal: Negative.   Skin: Negative.   Allergic/Immunologic: Negative.   Neurological: Negative.   Hematological: Negative.   Psychiatric/Behavioral: Negative.     Social History      She  reports that she has never smoked. She has never used smokeless tobacco. She reports current alcohol use of about 1.0 standard drinks of alcohol per week. She reports that she does not use drugs.       Social History   Socioeconomic History  . Marital status: Married    Spouse name: Not on file  . Number of children: 2  . Years of education: Not on file  . Highest education level: Not on file  Occupational History  . Not on file  Social Needs  . Financial resource strain: Not on file  . Food insecurity    Worry: Not on file    Inability: Not on file  . Transportation needs    Medical: Not on file    Non-medical: Not on file  Tobacco Use  . Smoking status: Never Smoker  . Smokeless tobacco: Never Used  Substance and Sexual Activity  . Alcohol use: Yes    Alcohol/week: 1.0 standard drinks    Types: 1 Cans of beer per week    Comment: Ocassional-last time  . Drug use: No  .  Sexual activity: Not on file  Lifestyle  . Physical activity    Days per week: Not on file    Minutes per session: Not on file  . Stress: Not on file  Relationships  . Social Herbalist on phone: Not on file    Gets together: Not on file    Attends religious service: Not on file    Active member of club or organization: Not on file    Attends meetings of clubs or organizations: Not on file    Relationship status: Not on file  Other Topics Concern  . Not on file  Social History Narrative  . Not on file    Past Medical History:  Diagnosis Date  . Diverticulosis 2007  . GERD (gastroesophageal reflux disease)   . Hyperlipidemia   . IBS (irritable bowel syndrome)      Patient Active Problem List   Diagnosis Date Noted  . Abdominal pain 07/29/2015  . Allergic rhinitis 07/29/2015  . Melanocytic nevus of skin 07/29/2015  . History of measles, mumps, or rubella 07/29/2015  . Endometriosis 07/29/2015  . GERD (gastroesophageal reflux disease) 07/29/2015  . Hypercholesteremia 07/29/2015  . Irritable bowel syndrome 07/29/2015  . Osteopenia 07/29/2015  . Postmenopausal 07/29/2015  . Seasonal allergies 07/29/2015    Past Surgical History:  Procedure Laterality Date  . ABDOMINAL HYSTERECTOMY  2005  . APPENDECTOMY  1999  . CATARACT  EXTRACTION W/PHACO Right 09/18/2016   Procedure: CATARACT EXTRACTION PHACO AND INTRAOCULAR LENS PLACEMENT (IOC);  Surgeon: Birder Robson, MD;  Location: [facility];  Service: Ophthalmology;  Laterality: Right;  [place] 42.4 AP% 18.6 CDE 7.89 Fluid pack lot # [id number]  . CATARACT EXTRACTION W/PHACO Left 10/02/2016   Procedure: CATARACT EXTRACTION PHACO AND INTRAOCULAR LENS PLACEMENT (IOC);  Surgeon: Birder Robson, MD;  Location: [facility];  Service: Ophthalmology;  Laterality: Left;  [place] 51.9 AP% 20.9 CDE 10.86 Fluid pack lot # [id number]  . COLONOSCOPY  2007  . COLONOSCOPY WITH PROPOFOL N/A 09/05/2016   Procedure: COLONOSCOPY WITH PROPOFOL;   Surgeon: Christene Lye, MD;  Location: [facility];  Service: Endoscopy;  Laterality: N/A;  . LAPAROSCOPY  1986  . TONSILLECTOMY  1984  . UPPER GASTROINTESTINAL ENDOSCOPY  04/03/2004   Normal    Family History        Family Status  Relation Name Status  . Mother  Deceased at age 77       Diverticulosis of colon  . Father  Deceased at age 69       died fromheart infection; CABG  . Sister  Alive  . Brother  Alive       stent age 106  . Sister  Alive  . Mat Aunt  (Not Specified)        Her family history includes Breast cancer in her maternal aunt; CAD in her father and mother; Cancer in her maternal aunt and mother; Diabetes in her mother and sister; GER disease in her father; Hyperlipidemia in her sister; Hypertension in her mother and sister.      No Known Allergies   Current Outpatient Medications:  .  hyoscyamine (LEVBID) 0.375 MG 12 hr tablet, Take 1 tablet (0.375 mg total) by mouth 2 (two) times daily. (Patient not taking: Reported on 04/03/2019), Disp: 60 tablet, Rfl: 5 .  Lactobacillus (ACIDOPHILUS PO), Take 1 tablet by mouth daily., Disp: , Rfl:  .  polycarbophil (FIBERCON) 625 MG tablet, Take 3 tablets by mouth daily., Disp: , Rfl:  .  simvastatin (ZOCOR) 20 MG tablet, TAKE 1 TABLET BY MOUTH AT  BEDTIME (Patient not taking: Reported on 04/03/2019), Disp: 90 tablet, Rfl: 3   Patient Care Team: Mar Daring, PA-C as PCP - General (Family Medicine) Christene Lye, MD (General Surgery) Margarita Rana, MD as Referring Physician (Family Medicine)    Objective:    Vitals: BP 129/72 (BP Location: Left Arm, Patient Position: Sitting, Cuff Size: Large)   Pulse 85   Temp 98.3 F (36.8 C) (Oral)   Resp 16   Ht 5\' 5"  (1.651 m)   Wt 118 lb 9.6 oz (53.8 kg)   BMI 19.74 kg/m    Vitals:   04/03/19 1423  BP: 129/72  Pulse: 85  Resp: 16  Temp: 98.3 F (36.8 C)  TempSrc: Oral  Weight: 118 lb 9.6 oz (53.8 kg)  Height: 5\' 5"  (1.651 m)      Physical Exam Vitals signs reviewed.  Constitutional:      General: She is not in acute distress.    Appearance: Normal appearance. She is well-developed. She is not diaphoretic.  HENT:     Head: Normocephalic and atraumatic.     Right Ear: Hearing, tympanic membrane, ear canal and external ear normal.     Left Ear: Hearing, tympanic membrane, ear canal and external ear normal.     Nose: Nose normal.     Mouth/Throat:  Mouth: Mucous membranes are moist.     Pharynx: Oropharynx is clear. Uvula midline. No oropharyngeal exudate.  Eyes:     General: No scleral icterus.       Right eye: No discharge.        Left eye: No discharge.     Extraocular Movements: Extraocular movements intact.     Conjunctiva/sclera: Conjunctivae normal.     Pupils: Pupils are equal, round, and reactive to light.  Neck:     Musculoskeletal: Normal range of motion and neck supple.     Thyroid: No thyromegaly.     Vascular: No carotid bruit or JVD.     Trachea: No tracheal deviation.  Cardiovascular:     Rate and Rhythm: Normal rate and regular rhythm.     Pulses: Normal pulses.     Heart sounds: Normal heart sounds. No murmur. No friction rub. No gallop.   Pulmonary:     Effort: Pulmonary effort is normal. No respiratory distress.     Breath sounds: Normal breath sounds. No wheezing or rales.  Chest:     Chest wall: No tenderness.     Breasts: Breasts are symmetrical.        Right: Normal. No mass, nipple discharge, skin change or tenderness.        Left: Normal. No mass, nipple discharge, skin change or tenderness.     Comments: Slight inverted nipple bilaterally (turns inward at the 6 o'clock edge) Abdominal:     General: Bowel sounds are normal. There is no distension.     Palpations: Abdomen is soft. There is no mass.     Tenderness: There is no abdominal tenderness. There is no guarding or rebound.     Hernia: There is no hernia in the left inguinal area.  Genitourinary:    Exam position:  Supine.     Labia:        Right: No rash, tenderness, lesion or injury.        Left: No rash, tenderness, lesion or injury.      Vagina: Normal. No signs of injury. No vaginal discharge, erythema, tenderness or bleeding.     Cervix: No cervical motion tenderness, discharge or friability.     Adnexa:        Right: No mass, tenderness or fullness.         Left: No mass, tenderness or fullness.       Rectum: Normal.  Musculoskeletal: Normal range of motion.        General: No tenderness.  Lymphadenopathy:     Cervical: No cervical adenopathy.  Skin:    General: Skin is warm and dry.     Capillary Refill: Capillary refill takes less than 2 seconds.     Findings: No rash.  Neurological:     Mental Status: She is alert and oriented to person, place, and time.     Cranial Nerves: No cranial nerve deficit.     Coordination: Coordination normal.     Deep Tendon Reflexes: Reflexes are normal and symmetric.  Psychiatric:        Mood and Affect: Mood normal.        Behavior: Behavior normal.        Thought Content: Thought content normal.        Judgment: Judgment normal.      Depression Screen PHQ 2/9 Scores 04/03/2019 03/21/2018 08/03/2016  PHQ - 2 Score 0 0 0       Assessment & Plan:  Routine Health Maintenance and Physical Exam  Exercise Activities and Dietary recommendations Goals   None     Immunization History  Administered Date(s) Administered  . Influenza,inj,Quad PF,6+ Mos 08/03/2015  . Influenza-Unspecified 08/04/2018  . Tdap 02/01/2010, 12/13/2018  . Zoster Recombinat (Shingrix) 01/26/2017    Health Maintenance  Topic Date Due  . HIV Screening  12/27/1970  . PAP SMEAR-Modifier  08/03/2018  . INFLUENZA VACCINE  05/23/2019  . MAMMOGRAM  01/11/2020  . COLONOSCOPY  09/05/2026  . TETANUS/TDAP  12/13/2028  . Hepatitis C Screening  Completed     Discussed health benefits of physical activity, and encouraged her to engage in regular exercise appropriate  for her age and condition.    1. Annual physical exam Normal physical exam today. Will check labs as below and f/u pending lab results. If labs are stable and WNL she will not need to have these rechecked for one year at her next annual physical exam. She is to call the office in the meantime if she has any acute issue, questions or concerns. - CBC with Differential/Platelet - Comprehensive metabolic panel - Hemoglobin A1c - Lipid panel - TSH  2. Cervical cancer screening Pap collected today. Will send as below and f/u pending results. - Cytology - PAP  3. Hypercholesteremia Diet controlled. Had started Simvastatin last year but had myalgias. Started CoQ10 but ended up stopping both. Will check labs as below and f/u pending results. - Comprehensive metabolic panel - Lipid panel  4. Screening for HIV without presence of risk factors Will check labs as below and f/u pending results. - HIV Antibody (routine testing w rflx)  --------------------------------------------------------------------    Mar Daring, PA-C  [place]

## 2019-04-03 ENCOUNTER — Other Ambulatory Visit (HOSPITAL_COMMUNITY)
Admission: RE | Admit: 2019-04-03 | Discharge: 2019-04-03 | Disposition: A | Discharge status: Home / Self Care | Payer: BC Managed Care – PPO | Source: Ambulatory Visit | Attending: Physician Assistant | Admitting: Physician Assistant

## 2019-04-03 ENCOUNTER — Other Ambulatory Visit: Payer: Self-pay

## 2019-04-03 ENCOUNTER — Ambulatory Visit (INDEPENDENT_AMBULATORY_CARE_PROVIDER_SITE_OTHER): Payer: BC Managed Care – PPO | Admitting: Physician Assistant

## 2019-04-03 ENCOUNTER — Encounter: Payer: Self-pay | Admitting: Physician Assistant

## 2019-04-03 VITALS — BP 129/72 | HR 85 | Temp 98.3°F | Resp 16 | Ht 65.0 in | Wt 118.6 lb

## 2019-04-03 DIAGNOSIS — E7800 Pure hypercholesterolemia, unspecified: Secondary | ICD-10-CM | POA: Diagnosis not present

## 2019-04-03 DIAGNOSIS — Z0000 Encounter for general adult medical examination without abnormal findings: Secondary | ICD-10-CM | POA: Diagnosis not present

## 2019-04-03 DIAGNOSIS — Z114 Encounter for screening for human immunodeficiency virus [HIV]: Secondary | ICD-10-CM

## 2019-04-03 DIAGNOSIS — Z124 Encounter for screening for malignant neoplasm of cervix: Secondary | ICD-10-CM | POA: Insufficient documentation

## 2019-04-03 NOTE — Patient Instructions (Signed)
Health Maintenance for Postmenopausal Women Menopause is a normal process in which your reproductive ability comes to an end. This process happens gradually over a span of months to years, usually between the ages of 62 and 89. Menopause is complete when you have missed 12 consecutive menstrual periods. It is important to talk with your health care provider about some of the most common conditions that affect postmenopausal women, such as heart disease, cancer, and bone loss (osteoporosis). Adopting a healthy lifestyle and getting preventive care can help to promote your health and wellness. Those actions can also lower your chances of developing some of these common conditions. What should I know about menopause? During menopause, you may experience a number of symptoms, such as:  Moderate-to-severe hot flashes.  Night sweats.  Decrease in sex drive.  Mood swings.  Headaches.  Tiredness.  Irritability.  Memory problems.  Insomnia. Choosing to treat or not to treat menopausal changes is an individual decision that you make with your health care provider. What should I know about hormone replacement therapy and supplements? Hormone therapy products are effective for treating symptoms that are associated with menopause, such as hot flashes and night sweats. Hormone replacement carries certain risks, especially as you become older. If you are thinking about using estrogen or estrogen with progestin treatments, discuss the benefits and risks with your health care provider. What should I know about heart disease and stroke? Heart disease, heart attack, and stroke become more likely as you age. This may be due, in part, to the hormonal changes that your body experiences during menopause. These can affect how your body processes dietary fats, triglycerides, and cholesterol. Heart attack and stroke are both medical emergencies. There are many things that you can do to help prevent heart disease  and stroke:  Have your blood pressure checked at least every 1-2 years. High blood pressure causes heart disease and increases the risk of stroke.  If you are 79-72 years old, ask your health care provider if you should take aspirin to prevent a heart attack or a stroke.  Do not use any tobacco products, including cigarettes, chewing tobacco, or electronic cigarettes. If you need help quitting, ask your health care provider.  It is important to eat a healthy diet and maintain a healthy weight. ? Be sure to include plenty of vegetables, fruits, low-fat dairy products, and lean protein. ? Avoid eating foods that are high in solid fats, added sugars, or salt (sodium).  Get regular exercise. This is one of the most important things that you can do for your health. ? Try to exercise for at least 150 minutes each week. The type of exercise that you do should increase your heart rate and make you sweat. This is known as moderate-intensity exercise. ? Try to do strengthening exercises at least twice each week. Do these in addition to the moderate-intensity exercise.  Know your numbers.Ask your health care provider to check your cholesterol and your blood glucose. Continue to have your blood tested as directed by your health care provider.  What should I know about cancer screening? There are several types of cancer. Take the following steps to reduce your risk and to catch any cancer development as early as possible. Breast Cancer  Practice breast self-awareness. ? This means understanding how your breasts normally appear and feel. ? It also means doing regular breast self-exams. Let your health care provider know about any changes, no matter how small.  If you are 40 or  older, have a clinician do a breast exam (clinical breast exam or CBE) every year. Depending on your age, family history, and medical history, it may be recommended that you also have a yearly breast X-ray (mammogram).  If you  have a family history of breast cancer, talk with your health care provider about genetic screening.  If you are at high risk for breast cancer, talk with your health care provider about having an MRI and a mammogram every year.  Breast cancer (BRCA) gene test is recommended for women who have family members with BRCA-related cancers. Results of the assessment will determine the need for genetic counseling and BRCA1 and for BRCA2 testing. BRCA-related cancers include these types: ? Breast. This occurs in males or females. ? Ovarian. ? Tubal. This may also be called fallopian tube cancer. ? Cancer of the abdominal or pelvic lining (peritoneal cancer). ? Prostate. ? Pancreatic. Cervical, Uterine, and Ovarian Cancer Your health care provider may recommend that you be screened regularly for cancer of the pelvic organs. These include your ovaries, uterus, and vagina. This screening involves a pelvic exam, which includes checking for microscopic changes to the surface of your cervix (Pap test).  For women ages 21-65, health care providers may recommend a pelvic exam and a Pap test every three years. For women ages 39-65, they may recommend the Pap test and pelvic exam, combined with testing for human papilloma virus (HPV), every five years. Some types of HPV increase your risk of cervical cancer. Testing for HPV may also be done on women of any age who have unclear Pap test results.  Other health care providers may not recommend any screening for nonpregnant women who are considered low risk for pelvic cancer and have no symptoms. Ask your health care provider if a screening pelvic exam is right for you.  If you have had past treatment for cervical cancer or a condition that could lead to cancer, you need Pap tests and screening for cancer for at least 20 years after your treatment. If Pap tests have been discontinued for you, your risk factors (such as having a new sexual partner) need to be reassessed  to determine if you should start having screenings again. Some women have medical problems that increase the chance of getting cervical cancer. In these cases, your health care provider may recommend that you have screening and Pap tests more often.  If you have a family history of uterine cancer or ovarian cancer, talk with your health care provider about genetic screening.  If you have vaginal bleeding after reaching menopause, tell your health care provider.  There are currently no reliable tests available to screen for ovarian cancer. Lung Cancer Lung cancer screening is recommended for adults 57-50 years old who are at high risk for lung cancer because of a history of smoking. A yearly low-dose CT scan of the lungs is recommended if you:  Currently smoke.  Have a history of at least 30 pack-years of smoking and you currently smoke or have quit within the past 15 years. A pack-year is smoking an average of one pack of cigarettes per day for one year. Yearly screening should:  Continue until it has been 15 years since you quit.  Stop if you develop a health problem that would prevent you from having lung cancer treatment. Colorectal Cancer  This type of cancer can be detected and can often be prevented.  Routine colorectal cancer screening usually begins at age 12 and continues through  age 63.  If you have risk factors for colon cancer, your health care provider may recommend that you be screened at an earlier age.  If you have a family history of colorectal cancer, talk with your health care provider about genetic screening.  Your health care provider may also recommend using home test kits to check for hidden blood in your stool.  A small camera at the end of a tube can be used to examine your colon directly (sigmoidoscopy or colonoscopy). This is done to check for the earliest forms of colorectal cancer.  Direct examination of the colon should be repeated every 5-10 years until  age 75. However, if early forms of precancerous polyps or small growths are found or if you have a family history or genetic risk for colorectal cancer, you may need to be screened more often. Skin Cancer  Check your skin from head to toe regularly.  Monitor any moles. Be sure to tell your health care provider: ? About any new moles or changes in moles, especially if there is a change in a mole's shape or color. ? If you have a mole that is larger than the size of a pencil eraser.  If any of your family members has a history of skin cancer, especially at a young age, talk with your health care provider about genetic screening.  Always use sunscreen. Apply sunscreen liberally and repeatedly throughout the day.  Whenever you are outside, protect yourself by wearing long sleeves, pants, a wide-brimmed hat, and sunglasses. What should I know about osteoporosis? Osteoporosis is a condition in which bone destruction happens more quickly than new bone creation. After menopause, you may be at an increased risk for osteoporosis. To help prevent osteoporosis or the bone fractures that can happen because of osteoporosis, the following is recommended:  If you are 59-59 years old, get at least 1,000 mg of calcium and at least 600 mg of vitamin D per day.  If you are older than age 36 but younger than age 32, get at least 1,200 mg of calcium and at least 600 mg of vitamin D per day.  If you are older than age 47, get at least 1,200 mg of calcium and at least 800 mg of vitamin D per day. Smoking and excessive alcohol intake increase the risk of osteoporosis. Eat foods that are rich in calcium and vitamin D, and do weight-bearing exercises several times each week as directed by your health care provider. What should I know about how menopause affects my mental health? Depression may occur at any age, but it is more common as you become older. Common symptoms of depression include:  Low or sad mood.   Changes in sleep patterns.  Changes in appetite or eating patterns.  Feeling an overall lack of motivation or enjoyment of activities that you previously enjoyed.  Frequent crying spells. Talk with your health care provider if you think that you are experiencing depression. What should I know about immunizations? It is important that you get and maintain your immunizations. These include:  Tetanus, diphtheria, and pertussis (Tdap) booster vaccine.  Influenza every year before the flu season begins.  Pneumonia vaccine.  Shingles vaccine. Your health care provider may also recommend other immunizations. This information is not intended to replace advice given to you by your health care provider. Make sure you discuss any questions you have with your health care provider. Document Released: 11/30/2005 Document Revised: 04/27/2016 Document Reviewed: 07/12/2015 Elsevier Interactive Patient Education  2019 [place].

## 2019-04-07 ENCOUNTER — Telehealth: Payer: Self-pay

## 2019-04-07 DIAGNOSIS — E7800 Pure hypercholesterolemia, unspecified: Secondary | ICD-10-CM | POA: Diagnosis not present

## 2019-04-07 DIAGNOSIS — Z114 Encounter for screening for human immunodeficiency virus [HIV]: Secondary | ICD-10-CM | POA: Diagnosis not present

## 2019-04-07 DIAGNOSIS — Z0000 Encounter for general adult medical examination without abnormal findings: Secondary | ICD-10-CM | POA: Diagnosis not present

## 2019-04-07 LAB — CYTOLOGY - PAP
Diagnosis: NEGATIVE
HPV: NOT DETECTED

## 2019-04-07 NOTE — Telephone Encounter (Signed)
-----   Message from Mar Daring, [place] sent at 04/07/2019  1:07 PM EDT ----- Pap is normal, HPV negative.  Will repeat in 5 years if desired.

## 2019-04-07 NOTE — Telephone Encounter (Signed)
Patient advised as directed below. 

## 2019-04-08 LAB — COMPREHENSIVE METABOLIC PANEL
ALT: 15 IU/L (ref 0–32)
AST: 21 IU/L (ref 0–40)
Albumin/Globulin Ratio: 2.2 (ref 1.2–2.2)
Albumin: 4.3 g/dL (ref 3.8–4.8)
Alkaline Phosphatase: 64 IU/L (ref 39–117)
BUN/Creatinine Ratio: 13 (ref 12–28)
BUN: 11 mg/dL (ref 8–27)
Bilirubin Total: 0.6 mg/dL (ref 0.0–1.2)
CO2: 23 mmol/L (ref 20–29)
Calcium: 9.4 mg/dL (ref 8.7–10.3)
Chloride: 106 mmol/L (ref 96–106)
Creatinine, Ser: 0.84 mg/dL (ref 0.57–1.00)
GFR calc Af Amer: 86 mL/min/{1.73_m2} (ref >59)
GFR calc non Af Amer: 74 mL/min/{1.73_m2} (ref >59)
Globulin, Total: 2 g/dL (ref 1.5–4.5)
Glucose: 97 mg/dL (ref 65–99)
Potassium: 4.1 mmol/L (ref 3.5–5.2)
Sodium: 141 mmol/L (ref 134–144)
Total Protein: 6.3 g/dL (ref 6.0–8.5)

## 2019-04-08 LAB — CBC WITH DIFFERENTIAL/PLATELET
Basophils Absolute: 0.1 10*3/uL (ref 0.0–0.2)
Basos: 1 %
EOS (ABSOLUTE): 0.2 10*3/uL (ref 0.0–0.4)
Eos: 5 %
Hematocrit: 37.5 % (ref 34.0–46.6)
Hemoglobin: 13 g/dL (ref 11.1–15.9)
Immature Grans (Abs): 0 10*3/uL (ref 0.0–0.1)
Immature Granulocytes: 0 %
Lymphocytes Absolute: 1.8 10*3/uL (ref 0.7–3.1)
Lymphs: 39 %
MCH: 31.6 pg (ref 26.6–33.0)
MCHC: 34.7 g/dL (ref 31.5–35.7)
MCV: 91 fL (ref 79–97)
Monocytes Absolute: 0.5 10*3/uL (ref 0.1–0.9)
Monocytes: 12 %
Neutrophils Absolute: 2 10*3/uL (ref 1.4–7.0)
Neutrophils: 43 %
Platelets: 195 10*3/uL (ref 150–450)
RBC: 4.12 x10E6/uL (ref 3.77–5.28)
RDW: 12.4 % (ref 11.7–15.4)
WBC: 4.6 10*3/uL (ref 3.4–10.8)

## 2019-04-08 LAB — LIPID PANEL
Chol/HDL Ratio: 2.9 ratio (ref 0.0–4.4)
Cholesterol, Total: 179 mg/dL (ref 100–199)
HDL: 61 mg/dL (ref >39)
LDL Calculated: 96 mg/dL (ref 0–99)
Triglycerides: 108 mg/dL (ref 0–149)
VLDL Cholesterol Cal: 22 mg/dL (ref 5–40)

## 2019-04-08 LAB — TSH: TSH: 3.91 u[IU]/mL (ref 0.450–4.500)

## 2019-04-08 LAB — HIV ANTIBODY (ROUTINE TESTING W REFLEX): HIV Screen 4th Generation wRfx: NONREACTIVE

## 2019-04-08 LAB — HEMOGLOBIN A1C
Est. average glucose Bld gHb Est-mCnc: 114 mg/dL
Hgb A1c MFr Bld: 5.6 % (ref 4.8–5.6)

## 2019-04-10 ENCOUNTER — Ambulatory Visit
Admission: RE | Admit: 2019-04-10 | Discharge: 2019-04-10 | Disposition: A | Discharge status: Home / Self Care | Payer: BC Managed Care – PPO | Source: Ambulatory Visit | Attending: Physician Assistant | Admitting: Physician Assistant

## 2019-04-10 ENCOUNTER — Telehealth: Payer: Self-pay

## 2019-04-10 ENCOUNTER — Other Ambulatory Visit: Payer: Self-pay

## 2019-04-10 DIAGNOSIS — Z1231 Encounter for screening mammogram for malignant neoplasm of breast: Secondary | ICD-10-CM

## 2019-04-10 NOTE — Telephone Encounter (Signed)
-----   Message from Mar Daring, [place] sent at 04/10/2019  2:04 PM EDT ----- Normal mammogram. Repeat screening in one year.

## 2019-04-10 NOTE — Telephone Encounter (Signed)
LVMTRC 

## 2019-04-13 NOTE — Telephone Encounter (Signed)
LM that mammogram normal pt also has mychart active

## 2019-06-01 ENCOUNTER — Encounter: Payer: Self-pay | Admitting: Physician Assistant

## 2019-06-01 DIAGNOSIS — E7800 Pure hypercholesterolemia, unspecified: Secondary | ICD-10-CM

## 2019-06-01 MED ORDER — SIMVASTATIN 20 MG PO TABS: 20.0000 mg | ORAL_TABLET | Freq: Every day | ORAL | 3 refills | Status: AC

## 2019-09-11 ENCOUNTER — Encounter: Payer: Self-pay | Admitting: Physician Assistant

## 2019-09-11 MED ORDER — HYOSCYAMINE SULFATE ER 0.375 MG PO TB12: 0.3750 mg | ORAL_TABLET | Freq: Two times a day (BID) | ORAL | 1 refills | Status: AC

## 2019-09-11 NOTE — Telephone Encounter (Signed)
Please review

## 2019-09-18 IMAGING — MG DIGITAL SCREENING BILATERAL MAMMOGRAM WITH TOMO AND CAD
6 of 10 series · 6 of 30 positions shown · non-contrast
Comparison: Previous exam(s).

CLINICAL DATA: Screening.

EXAM:
DIGITAL SCREENING BILATERAL MAMMOGRAM WITH TOMO AND CAD

[L MLO synth-2D]
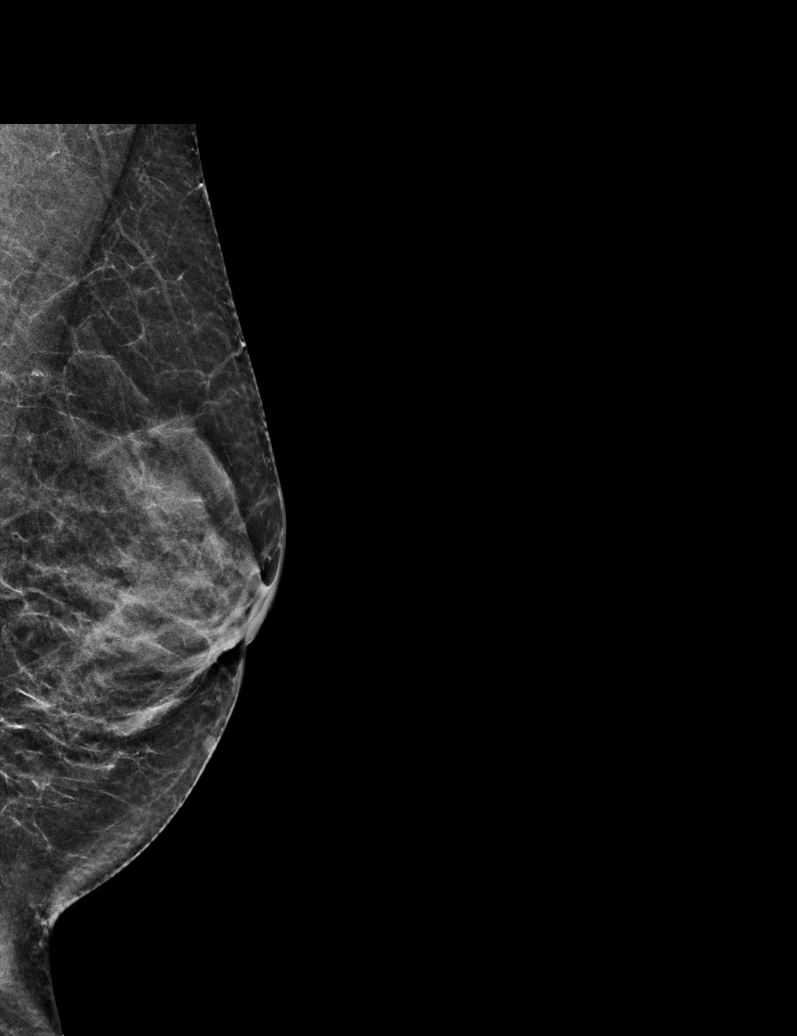

[R CC synth-2D (1 of 2)]
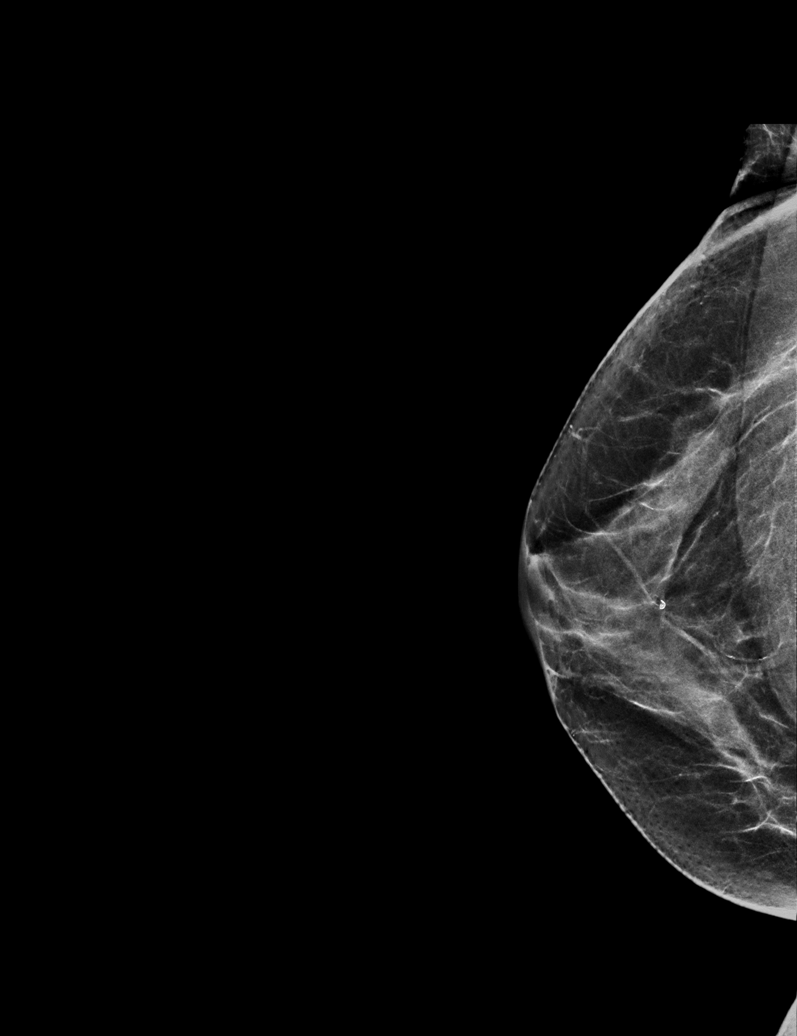

[R MLO synth-2D]
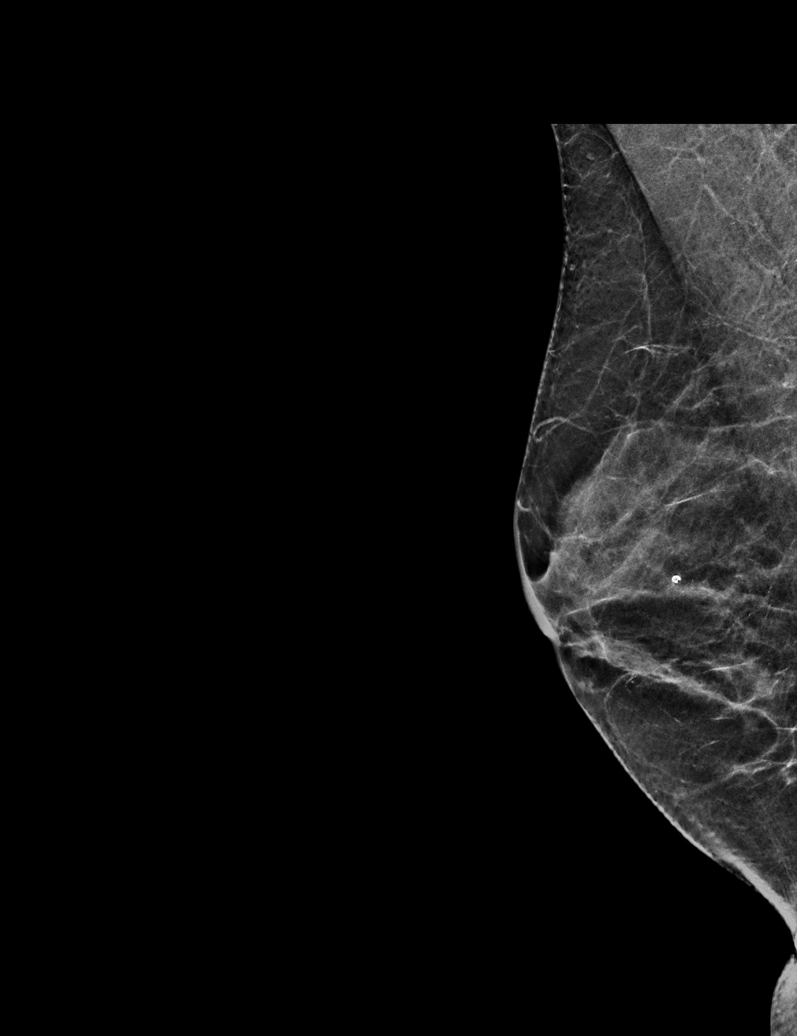

[L CC synth-2D]
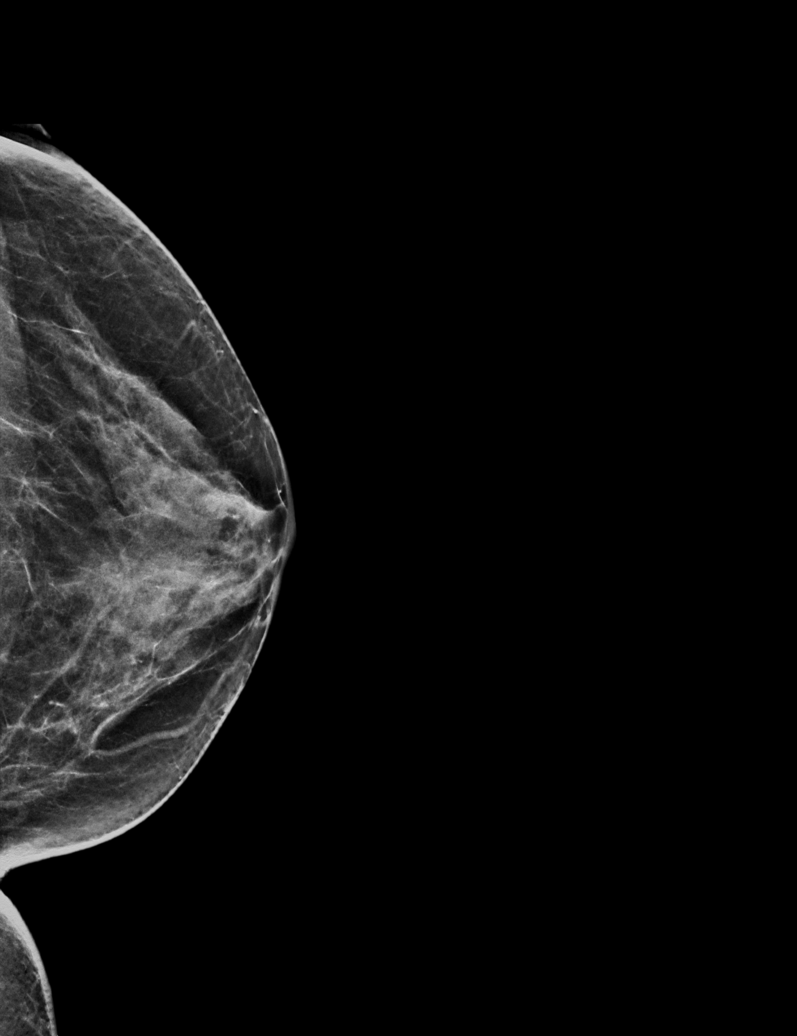

[R CC synth-2D (2 of 2)]
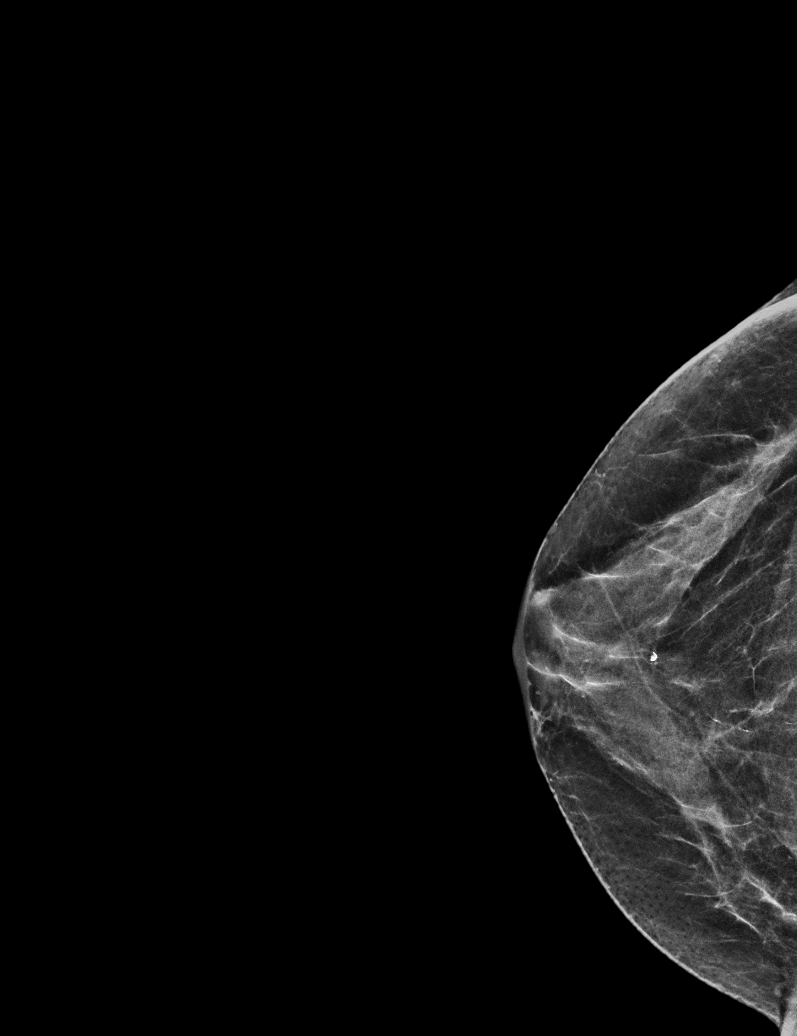

[L CC tomo · tomo slice 25/49.0]
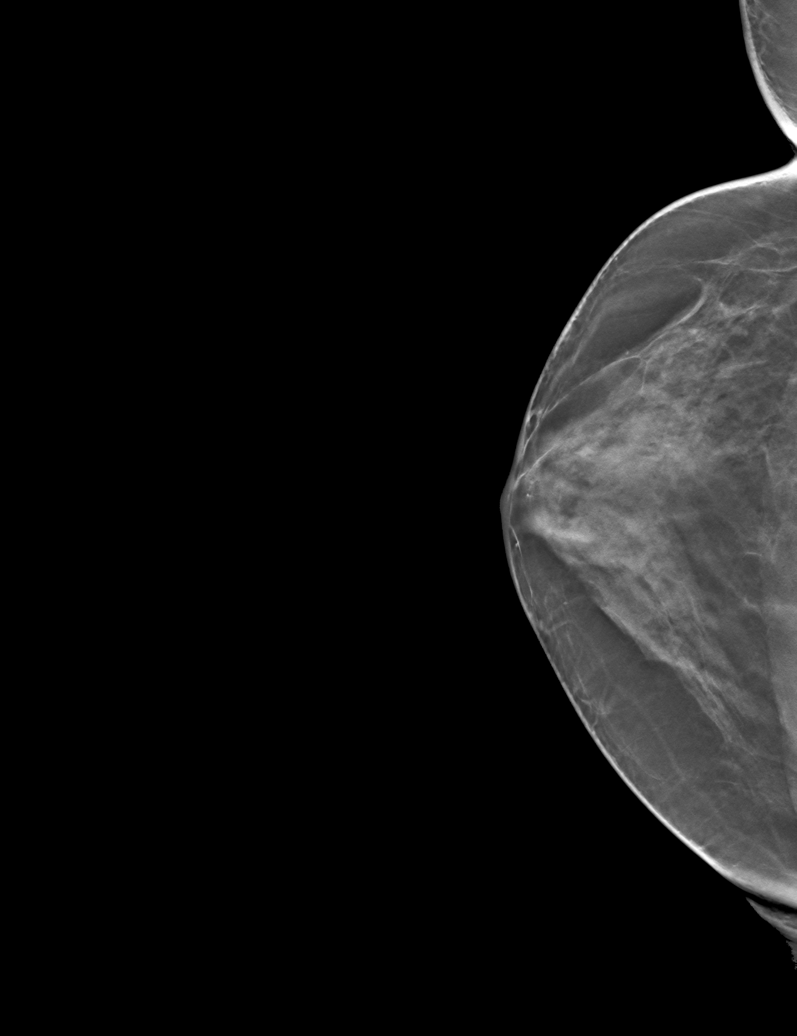

[6 of 30 positions shown; findings below may reference images not displayed]

ACR Breast Density Category c: The breast tissue is heterogeneously
dense, which may obscure small masses.
FINDINGS: There are no findings suspicious for malignancy. Images were
processed with CAD.
IMPRESSION: No mammographic evidence of malignancy. A result letter of this
screening mammogram will be mailed directly to the patient.

RECOMMENDATION:
Screening mammogram in one year. (Code:[id number])

BI-RADS CATEGORY  1: Negative.

## 2019-09-23 DIAGNOSIS — Z03818 Encounter for observation for suspected exposure to other biological agents ruled out: Secondary | ICD-10-CM | POA: Diagnosis not present

## 2019-10-29 DIAGNOSIS — Z20828 Contact with and (suspected) exposure to other viral communicable diseases: Secondary | ICD-10-CM | POA: Diagnosis not present

## 2019-11-09 DIAGNOSIS — Z20828 Contact with and (suspected) exposure to other viral communicable diseases: Secondary | ICD-10-CM | POA: Diagnosis not present

## 2019-12-07 ENCOUNTER — Other Ambulatory Visit: Payer: Self-pay

## 2019-12-07 ENCOUNTER — Ambulatory Visit: Payer: Self-pay | Attending: Internal Medicine

## 2019-12-07 DIAGNOSIS — Z23 Encounter for immunization: Secondary | ICD-10-CM | POA: Insufficient documentation

## 2019-12-07 NOTE — Progress Notes (Signed)
   Covid-19 Vaccination Clinic  Name:  Christina Williams    MRN: [id number] DOB: [birth year]  12/07/2019  [name] was observed post Covid-19 immunization for 15 minutes without incidence. She was provided with Vaccine Information Sheet and instruction to access the V-Safe system.   [name] was instructed to call 911 with any severe reactions post vaccine: Marland Kitchen Difficulty breathing  . Swelling of your face and throat  . A fast heartbeat  . A bad rash all over your body  . Dizziness and weakness    Immunizations Administered    Name Date Dose VIS Date Route   Pfizer COVID-19 Vaccine 12/07/2019 10:33 AM 0.3 mL 10/02/2019 Intramuscular   Manufacturer: [place]   Lot: [id number]   [place]: [id number]

## 2020-01-05 ENCOUNTER — Ambulatory Visit: Payer: Self-pay | Attending: Internal Medicine

## 2020-01-05 DIAGNOSIS — Z23 Encounter for immunization: Secondary | ICD-10-CM

## 2020-01-05 NOTE — Progress Notes (Signed)
   Covid-19 Vaccination Clinic  Name:  Christina Williams    MRN: [id number] DOB: 07-11-1956  01/05/2020  [name] was observed post Covid-19 immunization for 15 minutes without incident. She was provided with Vaccine Information Sheet and instruction to access the V-Safe system.   [name] was instructed to call 911 with any severe reactions post vaccine: Marland Kitchen Difficulty breathing  . Swelling of face and throat  . A fast heartbeat  . A bad rash all over body  . Dizziness and weakness   Immunizations Administered    Name Date Dose VIS Date Route   Pfizer COVID-19 Vaccine 01/05/2020  9:32 AM 0.3 mL 10/02/2019 Intramuscular   Manufacturer: [employer]   Lot: [id number]   [place]: [id number]

## 2020-02-26 ENCOUNTER — Other Ambulatory Visit: Payer: Self-pay | Admitting: Physician Assistant

## 2020-02-26 ENCOUNTER — Other Ambulatory Visit: Payer: Self-pay

## 2020-02-26 DIAGNOSIS — Z1231 Encounter for screening mammogram for malignant neoplasm of breast: Secondary | ICD-10-CM

## 2020-04-27 ENCOUNTER — Encounter: Payer: Self-pay | Admitting: Physician Assistant

## 2020-05-02 DIAGNOSIS — M8580 Other specified disorders of bone density and structure, unspecified site: Secondary | ICD-10-CM | POA: Diagnosis not present

## 2020-05-02 DIAGNOSIS — Z0000 Encounter for general adult medical examination without abnormal findings: Secondary | ICD-10-CM | POA: Diagnosis not present

## 2020-05-02 DIAGNOSIS — E785 Hyperlipidemia, unspecified: Secondary | ICD-10-CM | POA: Diagnosis not present

## 2020-05-02 DIAGNOSIS — Z136 Encounter for screening for cardiovascular disorders: Secondary | ICD-10-CM | POA: Diagnosis not present

## 2020-05-04 DIAGNOSIS — E785 Hyperlipidemia, unspecified: Secondary | ICD-10-CM | POA: Diagnosis not present

## 2020-05-04 DIAGNOSIS — Z136 Encounter for screening for cardiovascular disorders: Secondary | ICD-10-CM | POA: Diagnosis not present

## 2022-05-23 ENCOUNTER — Telehealth: Payer: Self-pay

## 2022-05-23 NOTE — Telephone Encounter (Unsigned)
Copied from [place]. Topic: General - Other >> May 23, 2022  4:12 PM Ludger Nutting wrote: Pt moved and new pcp does not see in her medical records that pt received 2nd dose of shingles vaccine. Pt would like to know if she received 2nd dose. Please follow up with pt.

## 2022-05-24 NOTE — Telephone Encounter (Signed)
Immunization reviewed. Patient aware she is due for 2nd shingrix vaccine.
